# Patient Record
Sex: Female | Born: 1965 | Race: Black or African American | Hispanic: No | Marital: Single | Smoking: Never smoker
Health system: Southern US, Community
[De-identification: ages and names within clinical notes are randomized; demographics above are authoritative.]

## PROBLEM LIST (undated history)

## (undated) DIAGNOSIS — K219 Gastro-esophageal reflux disease without esophagitis: Secondary | ICD-10-CM

## (undated) DIAGNOSIS — R519 Headache, unspecified: Secondary | ICD-10-CM

## (undated) DIAGNOSIS — I10 Essential (primary) hypertension: Secondary | ICD-10-CM

## (undated) DIAGNOSIS — M25569 Pain in unspecified knee: Secondary | ICD-10-CM

## (undated) DIAGNOSIS — F32A Depression, unspecified: Secondary | ICD-10-CM

## (undated) DIAGNOSIS — M7989 Other specified soft tissue disorders: Secondary | ICD-10-CM

## (undated) DIAGNOSIS — E785 Hyperlipidemia, unspecified: Secondary | ICD-10-CM

## (undated) DIAGNOSIS — K829 Disease of gallbladder, unspecified: Secondary | ICD-10-CM

## (undated) DIAGNOSIS — K5900 Constipation, unspecified: Secondary | ICD-10-CM

## (undated) DIAGNOSIS — M549 Dorsalgia, unspecified: Secondary | ICD-10-CM

## (undated) DIAGNOSIS — D573 Sickle-cell trait: Secondary | ICD-10-CM

## (undated) DIAGNOSIS — R5383 Other fatigue: Secondary | ICD-10-CM

## (undated) DIAGNOSIS — R0602 Shortness of breath: Secondary | ICD-10-CM

## (undated) DIAGNOSIS — M2550 Pain in unspecified joint: Secondary | ICD-10-CM

## (undated) DIAGNOSIS — R232 Flushing: Secondary | ICD-10-CM

## (undated) HISTORY — DX: Disease of gallbladder, unspecified: K82.9

## (undated) HISTORY — DX: Other fatigue: R53.83

## (undated) HISTORY — DX: Hyperlipidemia, unspecified: E78.5

## (undated) HISTORY — DX: Other specified soft tissue disorders: M79.89

## (undated) HISTORY — DX: Depression, unspecified: F32.A

## (undated) HISTORY — DX: Essential (primary) hypertension: I10

## (undated) HISTORY — DX: Headache, unspecified: R51.9

## (undated) HISTORY — DX: Dorsalgia, unspecified: M54.9

## (undated) HISTORY — DX: Gastro-esophageal reflux disease without esophagitis: K21.9

## (undated) HISTORY — DX: Constipation, unspecified: K59.00

## (undated) HISTORY — DX: Pain in unspecified joint: M25.50

## (undated) HISTORY — DX: Flushing: R23.2

## (undated) HISTORY — DX: Pain in unspecified knee: M25.569

## (undated) HISTORY — DX: Sickle-cell trait: D57.3

## (undated) HISTORY — DX: Shortness of breath: R06.02

---

## 2001-05-29 HISTORY — PX: TUBAL LIGATION: SHX77

## 2007-05-30 HISTORY — PX: CHOLECYSTECTOMY: SHX55

## 2010-05-29 HISTORY — PX: PARTIAL HYSTERECTOMY: SHX80

## 2014-03-02 ENCOUNTER — Other Ambulatory Visit (HOSPITAL_COMMUNITY)
Admission: RE | Admit: 2014-03-02 | Discharge: 2014-03-02 | Disposition: A | Discharge status: Home / Self Care | Payer: Managed Care, Other (non HMO) | Source: Ambulatory Visit | Attending: Family Medicine | Admitting: Family Medicine

## 2014-03-02 DIAGNOSIS — Z1151 Encounter for screening for human papillomavirus (HPV): Secondary | ICD-10-CM | POA: Diagnosis present

## 2014-03-02 DIAGNOSIS — Z124 Encounter for screening for malignant neoplasm of cervix: Secondary | ICD-10-CM | POA: Diagnosis not present

## 2014-03-18 ENCOUNTER — Other Ambulatory Visit: Payer: Self-pay

## 2014-03-18 DIAGNOSIS — Z1239 Encounter for other screening for malignant neoplasm of breast: Secondary | ICD-10-CM

## 2014-04-10 ENCOUNTER — Ambulatory Visit
Admission: RE | Admit: 2014-04-10 | Discharge: 2014-04-10 | Disposition: A | Discharge status: Home / Self Care | Payer: Managed Care, Other (non HMO) | Source: Ambulatory Visit

## 2014-04-10 DIAGNOSIS — Z1239 Encounter for other screening for malignant neoplasm of breast: Secondary | ICD-10-CM

## 2015-05-30 HISTORY — PX: OTHER SURGICAL HISTORY: SHX169

## 2016-04-12 ENCOUNTER — Ambulatory Visit
Admission: RE | Admit: 2016-04-12 | Discharge: 2016-04-12 | Disposition: A | Discharge status: Home / Self Care | Payer: BC Managed Care – PPO | Source: Ambulatory Visit | Attending: Internal Medicine | Admitting: Internal Medicine

## 2016-04-12 ENCOUNTER — Other Ambulatory Visit: Payer: Self-pay | Admitting: Internal Medicine

## 2016-04-12 DIAGNOSIS — Z1231 Encounter for screening mammogram for malignant neoplasm of breast: Secondary | ICD-10-CM

## 2018-08-07 ENCOUNTER — Encounter (INDEPENDENT_AMBULATORY_CARE_PROVIDER_SITE_OTHER): Payer: Self-pay

## 2018-08-13 ENCOUNTER — Ambulatory Visit (INDEPENDENT_AMBULATORY_CARE_PROVIDER_SITE_OTHER): Payer: Self-pay | Admitting: Family Medicine

## 2018-08-27 ENCOUNTER — Ambulatory Visit (INDEPENDENT_AMBULATORY_CARE_PROVIDER_SITE_OTHER): Payer: Self-pay | Admitting: Family Medicine

## 2019-04-11 ENCOUNTER — Other Ambulatory Visit: Payer: Self-pay | Admitting: Family Medicine

## 2019-04-11 DIAGNOSIS — Z1231 Encounter for screening mammogram for malignant neoplasm of breast: Secondary | ICD-10-CM

## 2019-06-02 ENCOUNTER — Ambulatory Visit: Payer: BC Managed Care – PPO

## 2019-11-18 ENCOUNTER — Other Ambulatory Visit: Payer: Self-pay

## 2019-11-18 ENCOUNTER — Ambulatory Visit
Admission: RE | Admit: 2019-11-18 | Discharge: 2019-11-18 | Disposition: A | Discharge status: Home / Self Care | Payer: BC Managed Care – PPO | Source: Ambulatory Visit | Attending: Family Medicine | Admitting: Family Medicine

## 2019-11-18 DIAGNOSIS — Z1231 Encounter for screening mammogram for malignant neoplasm of breast: Secondary | ICD-10-CM

## 2020-07-07 ENCOUNTER — Other Ambulatory Visit: Payer: Self-pay

## 2020-07-07 ENCOUNTER — Encounter (INDEPENDENT_AMBULATORY_CARE_PROVIDER_SITE_OTHER): Payer: Self-pay | Admitting: Family Medicine

## 2020-07-07 ENCOUNTER — Ambulatory Visit (INDEPENDENT_AMBULATORY_CARE_PROVIDER_SITE_OTHER): Payer: 59 | Admitting: Family Medicine

## 2020-07-07 VITALS — BP 150/88 | HR 72 | Temp 98.1°F | Ht 65.0 in | Wt 296.0 lb

## 2020-07-07 DIAGNOSIS — R5383 Other fatigue: Secondary | ICD-10-CM | POA: Diagnosis not present

## 2020-07-07 DIAGNOSIS — R0602 Shortness of breath: Secondary | ICD-10-CM | POA: Diagnosis not present

## 2020-07-07 DIAGNOSIS — I10 Essential (primary) hypertension: Secondary | ICD-10-CM | POA: Diagnosis not present

## 2020-07-07 DIAGNOSIS — E782 Mixed hyperlipidemia: Secondary | ICD-10-CM

## 2020-07-07 DIAGNOSIS — Z1331 Encounter for screening for depression: Secondary | ICD-10-CM | POA: Diagnosis not present

## 2020-07-07 DIAGNOSIS — Z6841 Body Mass Index (BMI) 40.0 and over, adult: Secondary | ICD-10-CM

## 2020-07-07 DIAGNOSIS — Z9189 Other specified personal risk factors, not elsewhere classified: Secondary | ICD-10-CM

## 2020-07-07 DIAGNOSIS — K9041 Non-celiac gluten sensitivity: Secondary | ICD-10-CM

## 2020-07-07 DIAGNOSIS — Z0289 Encounter for other administrative examinations: Secondary | ICD-10-CM

## 2020-07-08 LAB — CBC WITH DIFFERENTIAL/PLATELET
Basos: 1 %
Hemoglobin: 12.9 g/dL (ref 11.1–15.9)
Monocytes: 6 %
Neutrophils: 43 %

## 2020-07-08 LAB — VITAMIN B12: Vitamin B-12: 631 pg/mL (ref 232–1245)

## 2020-07-09 LAB — LIPID PANEL WITH LDL/HDL RATIO
Cholesterol, Total: 125 mg/dL (ref 100–199)
HDL: 34 mg/dL — ABNORMAL LOW (ref >39)
LDL Chol Calc (NIH): 75 mg/dL (ref 0–99)
LDL/HDL Ratio: 2.2 ratio (ref 0.0–3.2)
Triglycerides: 78 mg/dL (ref 0–149)
VLDL Cholesterol Cal: 16 mg/dL (ref 5–40)

## 2020-07-09 LAB — TSH: TSH: 3.48 u[IU]/mL (ref 0.450–4.500)

## 2020-07-09 LAB — INSULIN, RANDOM: INSULIN: 11.9 u[IU]/mL (ref 2.6–24.9)

## 2020-07-09 LAB — CBC WITH DIFFERENTIAL/PLATELET
Basophils Absolute: 0 10*3/uL (ref 0.0–0.2)
EOS (ABSOLUTE): 0.2 10*3/uL (ref 0.0–0.4)
Eos: 3 %
Hematocrit: 38.8 % (ref 34.0–46.6)
Immature Grans (Abs): 0 10*3/uL (ref 0.0–0.1)
Immature Granulocytes: 0 %
Lymphocytes Absolute: 2.8 10*3/uL (ref 0.7–3.1)
Lymphs: 47 %
MCH: 29.9 pg (ref 26.6–33.0)
MCHC: 33.2 g/dL (ref 31.5–35.7)
MCV: 90 fL (ref 79–97)
Monocytes Absolute: 0.3 10*3/uL (ref 0.1–0.9)
Neutrophils Absolute: 2.6 10*3/uL (ref 1.4–7.0)
Platelets: 321 10*3/uL (ref 150–450)
RBC: 4.31 x10E6/uL (ref 3.77–5.28)
RDW: 12.5 % (ref 11.7–15.4)
WBC: 6 10*3/uL (ref 3.4–10.8)

## 2020-07-09 LAB — T3: T3, Total: 115 ng/dL (ref 71–180)

## 2020-07-09 LAB — COMPREHENSIVE METABOLIC PANEL
ALT: 22 IU/L (ref 0–32)
AST: 19 IU/L (ref 0–40)
Albumin/Globulin Ratio: 1.7 (ref 1.2–2.2)
Albumin: 4.5 g/dL (ref 3.8–4.9)
Alkaline Phosphatase: 86 IU/L (ref 44–121)
BUN/Creatinine Ratio: 11 (ref 9–23)
BUN: 9 mg/dL (ref 6–24)
Bilirubin Total: 0.4 mg/dL (ref 0.0–1.2)
CO2: 24 mmol/L (ref 20–29)
Calcium: 9 mg/dL (ref 8.7–10.2)
Chloride: 101 mmol/L (ref 96–106)
Creatinine, Ser: 0.82 mg/dL (ref 0.57–1.00)
GFR calc Af Amer: 94 mL/min/{1.73_m2} (ref >59)
GFR calc non Af Amer: 82 mL/min/{1.73_m2} (ref >59)
Globulin, Total: 2.7 g/dL (ref 1.5–4.5)
Glucose: 88 mg/dL (ref 65–99)
Potassium: 4.6 mmol/L (ref 3.5–5.2)
Sodium: 141 mmol/L (ref 134–144)
Total Protein: 7.2 g/dL (ref 6.0–8.5)

## 2020-07-09 LAB — FOLATE: Folate: >20 ng/mL (ref >3.0)

## 2020-07-09 LAB — GLIADIN IGA+TTG IGA
Antigliadin Abs, IgA: 4 units (ref 0–19)
Transglutaminase IgA: <2 U/mL (ref 0–3)

## 2020-07-09 LAB — T4: T4, Total: 7.2 ug/dL (ref 4.5–12.0)

## 2020-07-09 LAB — VITAMIN D 25 HYDROXY (VIT D DEFICIENCY, FRACTURES): Vit D, 25-Hydroxy: 4.4 ng/mL — ABNORMAL LOW (ref 30.0–100.0)

## 2020-07-09 LAB — HEMOGLOBIN A1C
Est. average glucose Bld gHb Est-mCnc: 114 mg/dL
Hgb A1c MFr Bld: 5.6 % (ref 4.8–5.6)

## 2020-07-12 NOTE — Progress Notes (Unsigned)
Chief Complaint:   OBESITY Brandy Cannon (MR# [id number]) is a 55 y.o. female who presents for evaluation and treatment of obesity and related comorbidities. Current BMI is Body mass index is 49.26 kg/m. Brandy Cannon has been struggling with her weight for many years and has been unsuccessful in either losing weight, maintaining weight loss, or reaching her healthy weight goal.  Brandy Cannon is currently in the action stage of change and ready to dedicate time achieving and maintaining a healthier weight. Brandy Cannon is interested in becoming our patient and working on intensive lifestyle modifications including (but not limited to) diet and exercise for weight loss.  Brandy Cannon's habits were reviewed today and are as follows: her desired weight loss is 136 lbs, she has been heavy most of her life, she started gaining weight at 39, her heaviest weight ever was 300 pounds, she is a picky eater and doesn't like to eat healthier foods, she has significant food cravings issues, she snacks frequently in the evenings, she wakes up frequently in the middle of the night to eat, she skips meals frequently, she is frequently drinking liquids with calories, she frequently makes poor food choices, she has problems with excessive hunger, she frequently eats larger portions than normal and she struggles with emotional eating.  Depression Screen Brandy Cannon's Food and Mood (modified PHQ-9) score was 19.  Depression screen PHQ 2/9 07/07/2020  Decreased Interest 2  Down, Depressed, Hopeless 3  PHQ - 2 Score 5  Altered sleeping 3  Tired, decreased energy 3  Change in appetite 2  Feeling bad or failure about yourself  3  Trouble concentrating 2  Moving slowly or fidgety/restless 0  Suicidal thoughts 1  PHQ-9 Score 19  Difficult doing work/chores Somewhat difficult   Subjective:   1. Other fatigue Brandy Cannon admits to daytime somnolence and admits to waking up still tired. Patent has a history of symptoms of daytime fatigue and  morning headache. Brandy Cannon generally gets 2 or 3 hours of sleep per night, and states that she has nightime awakenings. Snoring is present. Apneic episodes are not present. Epworth Sleepiness Score is 8.  2. Shortness of breath on exertion Brandy Cannon notes increasing shortness of breath with exercising and seems to be worsening over time with weight gain. She notes getting out of breath sooner with activity than she used to. This has not gotten worse recently. Brandy Cannon denies shortness of breath at rest or orthopnea.  3. Primary hypertension Brandy Cannon's blood pressure is elevated today. She is on Benicar, and she is ready to work on diet and weight loss.  4. Mixed hyperlipidemia Brandy Cannon is on Lipitor, and she is working on diet and weight loss. She denies chest pain.  5. Gluten intolerance Brandy Cannon wonders if she has gluten intolerance, as she seems to have GI issues. She has not been evaluated for this previously. She still eats gluten but not as much.  6. At risk for heart disease Brandy Cannon is at a higher than average risk for cardiovascular disease due to obesity.   Assessment/Plan:   1. Other fatigue Brandy Cannon does feel that her weight is causing her energy to be lower than it should be. Fatigue may be related to obesity, depression or many other causes. Labs will be ordered, and in the meanwhile, Brandy Cannon will focus on self care including making healthy food choices, increasing physical activity and focusing on stress reduction.  - Vitamin B12 - CBC with Differential/Platelet - EKG 12-Lead - Folate - Hemoglobin A1c - Insulin, random -  T3 - T4 - TSH - VITAMIN D 25 Hydroxy (Vit-D Deficiency, Fractures)  2. Shortness of breath on exertion Brandy Cannon does feel that she gets out of breath more easily that she used to when she exercises. Brandy Cannon's shortness of breath appears to be obesity related and exercise induced. She has agreed to work on weight loss and gradually increase exercise to treat her exercise  induced shortness of breath. Will continue to monitor closely.  3. Primary hypertension We will check labs today. Brandy Cannon will start her Category 3 plan, and will work on healthy weight loss and exercise to improve blood pressure control. We will watch for signs of hypotension as she continues her lifestyle modifications.  - Comprehensive metabolic panel  4. Mixed hyperlipidemia Cardiovascular risk and specific lipid/LDL goals reviewed. We discussed several lifestyle modifications today. Brandy Cannon will start her Category 3 plan, and will work on exercise and weight loss efforts. We will check labs today. Orders and follow up as documented in patient record.   - Lipid Panel With LDL/HDL Ratio  5. Gluten intolerance We will check labs today. Brandy Cannon will continue to follow up as directed.  - Gliadin IgA+tTG IgA  6. Screening for depression Brandy Cannon had a positive depression screening. Depression is commonly associated with obesity and often results in emotional eating behaviors. We will monitor this closely and work on CBT to help improve the non-hunger eating patterns. Referral to Psychology may be required if no improvement is seen as she continues in our clinic.  7. At risk for heart disease Brandy Cannon was given approximately 30 minutes of coronary artery disease prevention counseling today. She is 55 y.o. female and has risk factors for heart disease including obesity. We discussed intensive lifestyle modifications today with an emphasis on specific weight loss instructions and strategies.   Repetitive spaced learning was employed today to elicit superior memory formation and behavioral change.  8. Class 3 severe obesity with serious comorbidity and body mass index (BMI) of 45.0 to 49.9 in adult, unspecified obesity type (HCC) Brandy Cannon is currently in the action stage of change and her goal is to continue with weight loss efforts. I recommend Brandy Cannon begin the structured treatment plan as  follows:  She has agreed to the Category 3 Plan.  Exercise goals: No exercise has been prescribed at this time.   Behavioral modification strategies: increasing lean protein intake and no skipping meals.  She was informed of the importance of frequent follow-up visits to maximize her success with intensive lifestyle modifications for her multiple health conditions. She was informed we would discuss her lab results at her next visit unless there is a critical issue that needs to be addressed sooner. Brandy Cannon agreed to keep her next visit at the agreed upon time to discuss these results.  Objective:   Blood pressure (!) 150/88, pulse 72, temperature 98.1 F (36.7 C), height 5\' 5"  (1.651 m), weight 296 lb (134.3 kg), SpO2 99 %. Body mass index is 49.26 kg/m.  EKG: Normal sinus rhythm, rate 73 BPM.  Indirect Calorimeter completed today shows a VO2 of 365 and a REE of 2539.  Her calculated basal metabolic rate is thus her basal metabolic rate is better than expected.  General: Cooperative, alert, well developed, in no acute distress. HEENT: Conjunctivae and lids unremarkable. Cardiovascular: Regular rhythm.  Lungs: Normal work of breathing. Neurologic: No focal deficits.   Lab Results  Component Value Date   CREATININE 0.82 07/07/2020   BUN 9 07/07/2020   NA 141  07/07/2020   K 4.6 07/07/2020   CL 101 07/07/2020   CO2 24 07/07/2020   Lab Results  Component Value Date   ALT 22 07/07/2020   AST 19 07/07/2020   ALKPHOS 86 07/07/2020   BILITOT 0.4 07/07/2020   Lab Results  Component Value Date   HGBA1C 5.6 07/07/2020   Lab Results  Component Value Date   INSULIN 11.9 07/07/2020   Lab Results  Component Value Date   TSH 3.480 07/07/2020   Lab Results  Component Value Date   CHOL 125 07/07/2020   HDL 34 (L) 07/07/2020   LDLCALC 75 07/07/2020   TRIG 78 07/07/2020   Lab Results  Component Value Date   WBC 6.0 07/07/2020   HGB 12.9 07/07/2020   HCT 38.8 07/07/2020    MCV 90 07/07/2020   PLT 321 07/07/2020   No results found for: IRON, TIBC, FERRITIN  Attestation Statements:   Reviewed by clinician on day of visit: allergies, medications, problem list, medical history, surgical history, family history, social history, and previous encounter notes.   I, Burt Knack, am acting as transcriptionist for Quillian Quince, MD.  I have reviewed the above documentation for accuracy and completeness, and I agree with the above. - Quillian Quince, MD

## 2020-07-21 ENCOUNTER — Ambulatory Visit (INDEPENDENT_AMBULATORY_CARE_PROVIDER_SITE_OTHER): Payer: 59 | Admitting: Family Medicine

## 2020-07-21 ENCOUNTER — Other Ambulatory Visit: Payer: Self-pay

## 2020-07-21 ENCOUNTER — Encounter (INDEPENDENT_AMBULATORY_CARE_PROVIDER_SITE_OTHER): Payer: Self-pay | Admitting: Family Medicine

## 2020-07-21 VITALS — BP 139/83 | HR 71 | Temp 98.2°F | Ht 65.0 in | Wt 288.0 lb

## 2020-07-21 DIAGNOSIS — Z9189 Other specified personal risk factors, not elsewhere classified: Secondary | ICD-10-CM | POA: Diagnosis not present

## 2020-07-21 DIAGNOSIS — Z6841 Body Mass Index (BMI) 40.0 and over, adult: Secondary | ICD-10-CM | POA: Diagnosis not present

## 2020-07-21 DIAGNOSIS — E8881 Metabolic syndrome: Secondary | ICD-10-CM | POA: Diagnosis not present

## 2020-07-21 DIAGNOSIS — E559 Vitamin D deficiency, unspecified: Secondary | ICD-10-CM | POA: Diagnosis not present

## 2020-07-21 MED ORDER — VITAMIN D (ERGOCALCIFEROL) 1.25 MG (50000 UNIT) PO CAPS: 50000.0000 [IU] | ORAL_CAPSULE | ORAL | 0 refills | Status: DC

## 2020-07-22 NOTE — Progress Notes (Signed)
Chief Complaint:   OBESITY Brandy Cannon is here to discuss her progress with her obesity treatment plan along with follow-up of her obesity related diagnoses. Brandy Cannon is on the Category 3 Plan and states she is following her eating plan approximately 25% of the time. Brandy Cannon states she is doing 0 minutes 0 times per week.  Today's visit was #: 2 Starting weight: 296 lbs Starting date: 07/07/2020 Today's weight: 288 lbs Today's date: 07/21/2020 Total lbs lost to date: 8 Total lbs lost since last in-office visit: 8  Interim History: Brandy Cannon has done very well with weight loss on her eating plan. She struggled to eat everything on her plan however, but her hunger was controlled.  Subjective:   1. Vitamin D deficiency Brandy Cannon's Vit D level is very low at 4.4. She notes significant fatigue. I discussed labs with the patient today.  2. Insulin resistance Brandy Cannon has a new diagnosis of insulin resistance. She notes decreased polyphagia with increased protein. I discussed labs with the patient today.  3. At risk for diabetes mellitus Brandy Cannon is at higher than average risk for developing diabetes due to obesity.   Assessment/Plan:   1. Vitamin D deficiency Low Vitamin D level contributes to fatigue and are associated with obesity, breast, and colon cancer. Brandy Cannon agreed to start prescription Vitamin D 50,000 IU every week with no refills. We will recheck labs in 3 months. She will follow-up for routine testing of Vitamin D, at least 2-3 times per year to avoid over-replacement.  - Vitamin D, Ergocalciferol, (DRISDOL) 1.25 MG (50000 UNIT) CAPS capsule; Take 1 capsule (50,000 Units total) by mouth every 3 (three) days.  Dispense: 10 capsule; Refill: 0  2. Insulin resistance Brandy Cannon will continue to work on weight loss, diet, exercise, and decreasing simple carbohydrates to help decrease the risk of diabetes. We will recheck labs in 3 months. Brandy Cannon agreed to follow-up with [place] as directed to closely  monitor her progress.  3. At risk for diabetes mellitus Brandy Cannon was given approximately 30 minutes of diabetes education and counseling today. We discussed intensive lifestyle modifications today with an emphasis on weight loss as well as increasing exercise and decreasing simple carbohydrates in her diet. We also reviewed medication options with an emphasis on risk versus benefit of those discussed.   Repetitive spaced learning was employed today to elicit superior memory formation and behavioral change.  4. Class 3 severe obesity with serious comorbidity and body mass index (BMI) of 45.0 to 49.9 in adult, unspecified obesity type (HCC) Brandy Cannon is currently in the action stage of change. As such, her goal is to continue with weight loss efforts. She has agreed to change to keeping a food journal and adhering to recommended goals of 1600-1800 calories and 100+ grams of protein daily.   Behavioral modification strategies: increasing lean protein intake, no skipping meals and meal planning and cooking strategies.  Brandy Cannon has agreed to follow-up with our clinic in 2 weeks. She was informed of the importance of frequent follow-up visits to maximize her success with intensive lifestyle modifications for her multiple health conditions.   Objective:   Blood pressure 139/83, pulse 71, temperature 98.2 F (36.8 C), height 5\' 5"  (1.651 m), weight 288 lb (130.6 kg), SpO2 99 %. Body mass index is 47.93 kg/m.  General: Cooperative, alert, well developed, in no acute distress. HEENT: Conjunctivae and lids unremarkable. Cardiovascular: Regular rhythm.  Lungs: Normal work of breathing. Neurologic: No focal deficits.   Lab Results  Component Value  Date   CREATININE 0.82 07/07/2020   BUN 9 07/07/2020   NA 141 07/07/2020   K 4.6 07/07/2020   CL 101 07/07/2020   CO2 24 07/07/2020   Lab Results  Component Value Date   ALT 22 07/07/2020   AST 19 07/07/2020   ALKPHOS 86 07/07/2020   BILITOT 0.4  07/07/2020   Lab Results  Component Value Date   HGBA1C 5.6 07/07/2020   Lab Results  Component Value Date   INSULIN 11.9 07/07/2020   Lab Results  Component Value Date   TSH 3.480 07/07/2020   Lab Results  Component Value Date   CHOL 125 07/07/2020   HDL 34 (L) 07/07/2020   LDLCALC 75 07/07/2020   TRIG 78 07/07/2020   Lab Results  Component Value Date   WBC 6.0 07/07/2020   HGB 12.9 07/07/2020   HCT 38.8 07/07/2020   MCV 90 07/07/2020   PLT 321 07/07/2020   No results found for: IRON, TIBC, FERRITIN  Attestation Statements:   Reviewed by clinician on day of visit: allergies, medications, problem list, medical history, surgical history, family history, social history, and previous encounter notes.   I, Burt Knack, am acting as transcriptionist for Quillian Quince, MD.  I have reviewed the above documentation for accuracy and completeness, and I agree with the above. -  Quillian Quince, MD

## 2020-08-04 ENCOUNTER — Ambulatory Visit (INDEPENDENT_AMBULATORY_CARE_PROVIDER_SITE_OTHER): Payer: 59 | Admitting: Family Medicine

## 2020-08-04 ENCOUNTER — Other Ambulatory Visit: Payer: Self-pay

## 2020-08-04 ENCOUNTER — Encounter (INDEPENDENT_AMBULATORY_CARE_PROVIDER_SITE_OTHER): Payer: Self-pay | Admitting: Family Medicine

## 2020-08-04 VITALS — BP 140/82 | HR 75 | Temp 98.5°F | Ht 65.0 in | Wt 284.0 lb

## 2020-08-04 DIAGNOSIS — I10 Essential (primary) hypertension: Secondary | ICD-10-CM

## 2020-08-04 DIAGNOSIS — E559 Vitamin D deficiency, unspecified: Secondary | ICD-10-CM

## 2020-08-04 DIAGNOSIS — Z9189 Other specified personal risk factors, not elsewhere classified: Secondary | ICD-10-CM | POA: Diagnosis not present

## 2020-08-04 DIAGNOSIS — K5909 Other constipation: Secondary | ICD-10-CM | POA: Diagnosis not present

## 2020-08-04 DIAGNOSIS — Z6841 Body Mass Index (BMI) 40.0 and over, adult: Secondary | ICD-10-CM

## 2020-08-04 MED ORDER — VITAMIN D (ERGOCALCIFEROL) 1.25 MG (50000 UNIT) PO CAPS: 50000.0000 [IU] | ORAL_CAPSULE | ORAL | 0 refills | Status: DC

## 2020-08-10 NOTE — Progress Notes (Signed)
Chief Complaint:   OBESITY Brandy Cannon is here to discuss her progress with her obesity treatment plan along with follow-up of her obesity related diagnoses. Brandy Cannon is on keeping a food journal and adhering to recommended goals of 1600-1800 calories and 100+ grams of protein daily and states she is following her eating plan approximately 50% of the time. Brandy Cannon states she is doing 0 minutes 0 times per week.  Today's visit was #: 3 Starting weight: 296 lbs Starting date: 07/07/2020 Today's weight: 284 lbs Today's date: 08/04/2020 Total lbs lost to date: 12 Total lbs lost since last in-office visit: 4  Interim History: Kahlani continues to do well with weight loss. She is struggling to journal all the time and meet her protein goals.  Subjective:   1. Other constipation Brandy Cannon notes her BMs have become harder and painful. She is now on miralax and she feels it is helping.  2. Essential hypertension Brandy Cannon's blood pressure is borderline elevated today. She has had increased work stress. She continues to do well with weight loss.  3. Vitamin D deficiency Brandy Cannon is on Vit D, and she feels her energy is already improving.  4. At risk for heart disease Brandy Cannon is at a higher than average risk for cardiovascular disease due to obesity.   Assessment/Plan:   1. Other constipation I explained to Brandy Cannon why her BMs have slowed with weight loss. She will continue to increase her water intake and will continue miralax 17 grams daily OTC is a good options. Orders and follow up as documented in patient record.   2. Essential hypertension Brandy Cannon will continue her medications, diet, and exercise to improve blood pressure control. We will continue to monitor, and will watch for signs of hypotension as she continues her lifestyle modifications.  3. Vitamin D deficiency Low Vitamin D level contributes to fatigue and are associated with obesity, breast, and colon cancer. We will refill prescription  Vitamin D for 1 month. Brandy Cannon will follow-up for routine testing of Vitamin D, at least 2-3 times per year to avoid over-replacement.  - Vitamin D, Ergocalciferol, (DRISDOL) 1.25 MG (50000 UNIT) CAPS capsule; Take 1 capsule (50,000 Units total) by mouth every 3 (three) days.  Dispense: 10 capsule; Refill: 0  4. At risk for heart disease Brandy Cannon was given approximately 15 minutes of coronary artery disease prevention counseling today. She is 55 y.o. female and has risk factors for heart disease including obesity. We discussed intensive lifestyle modifications today with an emphasis on specific weight loss instructions and strategies.   Repetitive spaced learning was employed today to elicit superior memory formation and behavioral change.  5. Class 3 severe obesity with serious comorbidity and body mass index (BMI) of 45.0 to 49.9 in adult, unspecified obesity type (HCC) Brandy Cannon is currently in the action stage of change. As such, her goal is to continue with weight loss efforts. She has agreed to keeping a food journal and adhering to recommended goals of 1600-1800 calories and 100+ grams of protein daily.   High protein recipes were given today.  Behavioral modification strategies: increasing lean protein intake, increasing water intake, meal planning and cooking strategies and keeping a strict food journal.  Brandy Cannon has agreed to follow-up with our clinic in 2 weeks. She was informed of the importance of frequent follow-up visits to maximize her success with intensive lifestyle modifications for her multiple health conditions.   Objective:   Blood pressure 140/82, pulse 75, temperature 98.5 F (36.9 C), height 5'  5" (1.651 m), weight 284 lb (128.8 kg), SpO2 100 %. Body mass index is 47.26 kg/m.  General: Cooperative, alert, well developed, in no acute distress. HEENT: Conjunctivae and lids unremarkable. Cardiovascular: Regular rhythm.  Lungs: Normal work of breathing. Neurologic: No focal  deficits.   Lab Results  Component Value Date   CREATININE 0.82 07/07/2020   BUN 9 07/07/2020   NA 141 07/07/2020   K 4.6 07/07/2020   CL 101 07/07/2020   CO2 24 07/07/2020   Lab Results  Component Value Date   ALT 22 07/07/2020   AST 19 07/07/2020   ALKPHOS 86 07/07/2020   BILITOT 0.4 07/07/2020   Lab Results  Component Value Date   HGBA1C 5.6 07/07/2020   Lab Results  Component Value Date   INSULIN 11.9 07/07/2020   Lab Results  Component Value Date   TSH 3.480 07/07/2020   Lab Results  Component Value Date   CHOL 125 07/07/2020   HDL 34 (L) 07/07/2020   LDLCALC 75 07/07/2020   TRIG 78 07/07/2020   Lab Results  Component Value Date   WBC 6.0 07/07/2020   HGB 12.9 07/07/2020   HCT 38.8 07/07/2020   MCV 90 07/07/2020   PLT 321 07/07/2020   No results found for: IRON, TIBC, FERRITIN  Attestation Statements:   Reviewed by clinician on day of visit: allergies, medications, problem list, medical history, surgical history, family history, social history, and previous encounter notes.   I, Burt Knack, am acting as transcriptionist for Quillian Quince, MD.  I have reviewed the above documentation for accuracy and completeness, and I agree with the above. -  Quillian Quince, MD

## 2020-08-12 ENCOUNTER — Other Ambulatory Visit (INDEPENDENT_AMBULATORY_CARE_PROVIDER_SITE_OTHER): Payer: Self-pay | Admitting: Family Medicine

## 2020-08-12 DIAGNOSIS — E559 Vitamin D deficiency, unspecified: Secondary | ICD-10-CM

## 2020-08-18 ENCOUNTER — Other Ambulatory Visit: Payer: Self-pay

## 2020-08-18 ENCOUNTER — Ambulatory Visit (INDEPENDENT_AMBULATORY_CARE_PROVIDER_SITE_OTHER): Payer: 59 | Admitting: Family Medicine

## 2020-08-18 ENCOUNTER — Encounter (INDEPENDENT_AMBULATORY_CARE_PROVIDER_SITE_OTHER): Payer: Self-pay | Admitting: Family Medicine

## 2020-08-18 VITALS — BP 119/77 | HR 65 | Temp 98.2°F | Ht 65.0 in | Wt 282.0 lb

## 2020-08-18 DIAGNOSIS — Z9189 Other specified personal risk factors, not elsewhere classified: Secondary | ICD-10-CM | POA: Diagnosis not present

## 2020-08-18 DIAGNOSIS — Z6841 Body Mass Index (BMI) 40.0 and over, adult: Secondary | ICD-10-CM

## 2020-08-18 DIAGNOSIS — I10 Essential (primary) hypertension: Secondary | ICD-10-CM | POA: Diagnosis not present

## 2020-08-18 DIAGNOSIS — G4709 Other insomnia: Secondary | ICD-10-CM

## 2020-08-18 MED ORDER — AMITRIPTYLINE HCL 25 MG PO TABS: 25.0000 mg | ORAL_TABLET | Freq: Every day | ORAL | 0 refills | Status: DC

## 2020-08-23 NOTE — Progress Notes (Signed)
Chief Complaint:   OBESITY Brandy Cannon is here to discuss her progress with her obesity treatment plan along with follow-up of her obesity related diagnoses. Brandy Cannon is on keeping a food journal and adhering to recommended goals of 1600-1800 calories and 100+ grams of protein daily and states she is following her eating plan approximately 99% of the time. Brandy Cannon states she is walking for 45 minutes 2 times per week.  Today's visit was #: 4 Starting weight: 296 lbs Starting date: 07/07/2020 Today's weight: 282 lbs Today's date: 08/18/2020 Total lbs lost to date: 14 Total lbs lost since last in-office visit: 2  Interim History: Brandy Cannon continues to do well with weight loss. She has had some increased cravings and increased stress as she is trying to move. She has had challenges with sleeping which can negativitely affect her weight.  Subjective:   1. Other insomnia Brandy Cannon is in the process of moving and she notes increased stress and difficultly falling asleep. This has not improved with turning off her screens.  2. Essential hypertension Brandy Cannon's blood pressure is well controlled on her medications. She denies signs of hypotension. She continue to do well with weight loss.  3. At risk for complication associated with hypotension Brandy Cannon is at a higher than average risk of hypotension due to weight loss.  Assessment/Plan:   1. Other insomnia The problem of recurrent insomnia was discussed. Orders and follow up as documented in patient record. Counseling: Intensive lifestyle modifications are the first line treatment for this issue. We discussed several lifestyle modifications today. Brandy Cannon agreed to start Elavil 25 mg qhs with no refills. She will continue to work on diet, exercise and weight loss efforts.   - amitriptyline (ELAVIL) 25 MG tablet; Take 1 tablet (25 mg total) by mouth at bedtime.  Dispense: 30 tablet; Refill: 0  2. Essential hypertension Brandy Cannon will continue Benicar as is,  and will continue to monitor. She will continue working on healthy weight loss and exercise to improve blood pressure control.  3. At risk for complication associated with hypotension Brandy Cannon was given approximately 15 minutes of education and counseling today to help avoid hypotension. We discussed risks of hypotension with weight loss and signs of hypotension such as feeling lightheaded or unsteady.  Repetitive spaced learning was employed today to elicit superior memory formation and behavioral change.  4. Obesity with current BMI 47.0 Brandy Cannon is currently in the action stage of change. As such, her goal is to continue with weight loss efforts. She has agreed to keeping a food journal and adhering to recommended goals of 1600-1800 calories and 100+ grams of protein daily.   Exercise goals: As is.  Behavioral modification strategies: meal planning and cooking strategies and emotional eating strategies.  Brandy Cannon has agreed to follow-up with our clinic in 2 weeks. She was informed of the importance of frequent follow-up visits to maximize her success with intensive lifestyle modifications for her multiple health conditions.   Objective:   Blood pressure 119/77, pulse 65, temperature 98.2 F (36.8 C), height 5\' 5"  (1.651 m), weight 282 lb (127.9 kg), SpO2 100 %. Body mass index is 46.93 kg/m.  General: Cooperative, alert, well developed, in no acute distress. HEENT: Conjunctivae and lids unremarkable. Cardiovascular: Regular rhythm.  Lungs: Normal work of breathing. Neurologic: No focal deficits.   Lab Results  Component Value Date   CREATININE 0.82 07/07/2020   BUN 9 07/07/2020   NA 141 07/07/2020   K 4.6 07/07/2020   CL 101  07/07/2020   CO2 24 07/07/2020   Lab Results  Component Value Date   ALT 22 07/07/2020   AST 19 07/07/2020   ALKPHOS 86 07/07/2020   BILITOT 0.4 07/07/2020   Lab Results  Component Value Date   HGBA1C 5.6 07/07/2020   Lab Results  Component Value  Date   INSULIN 11.9 07/07/2020   Lab Results  Component Value Date   TSH 3.480 07/07/2020   Lab Results  Component Value Date   CHOL 125 07/07/2020   HDL 34 (L) 07/07/2020   LDLCALC 75 07/07/2020   TRIG 78 07/07/2020   Lab Results  Component Value Date   WBC 6.0 07/07/2020   HGB 12.9 07/07/2020   HCT 38.8 07/07/2020   MCV 90 07/07/2020   PLT 321 07/07/2020   No results found for: IRON, TIBC, FERRITIN  Attestation Statements:   Reviewed by clinician on day of visit: allergies, medications, problem list, medical history, surgical history, family history, social history, and previous encounter notes.   I, Burt Knack, am acting as transcriptionist for Quillian Quince, MD.  I have reviewed the above documentation for accuracy and completeness, and I agree with the above. -  Quillian Quince, MD

## 2020-09-01 ENCOUNTER — Ambulatory Visit (INDEPENDENT_AMBULATORY_CARE_PROVIDER_SITE_OTHER): Payer: 59 | Admitting: Family Medicine

## 2020-09-01 ENCOUNTER — Other Ambulatory Visit: Payer: Self-pay

## 2020-09-01 ENCOUNTER — Encounter (INDEPENDENT_AMBULATORY_CARE_PROVIDER_SITE_OTHER): Payer: Self-pay | Admitting: Family Medicine

## 2020-09-01 VITALS — BP 123/79 | HR 84 | Temp 98.2°F | Ht 65.0 in | Wt 278.0 lb

## 2020-09-01 DIAGNOSIS — G4709 Other insomnia: Secondary | ICD-10-CM | POA: Diagnosis not present

## 2020-09-01 DIAGNOSIS — Z9189 Other specified personal risk factors, not elsewhere classified: Secondary | ICD-10-CM | POA: Diagnosis not present

## 2020-09-01 DIAGNOSIS — Z6841 Body Mass Index (BMI) 40.0 and over, adult: Secondary | ICD-10-CM | POA: Diagnosis not present

## 2020-09-01 DIAGNOSIS — E559 Vitamin D deficiency, unspecified: Secondary | ICD-10-CM

## 2020-09-01 MED ORDER — VITAMIN D (ERGOCALCIFEROL) 1.25 MG (50000 UNIT) PO CAPS: 50000.0000 [IU] | ORAL_CAPSULE | ORAL | 0 refills | Status: DC

## 2020-09-07 NOTE — Progress Notes (Signed)
Chief Complaint:   OBESITY Brandy Cannon is here to discuss her progress with her obesity treatment plan along with follow-up of her obesity related diagnoses.   Today's visit was #: 5 Starting weight: 296 lbs Starting date: 07/07/2020 Today's weight: 278 lbs Today's date: 09/01/2020 Total lbs lost to date: 18 lbs Body mass index is 46.26 kg/m.  Total weight loss percentage to date: -6.08%  Interim History:  Brandy Cannon is Dr. Francena Hanly patient.  She has been doing more stress eating this past week than she was the first week when she was more strict on the plan.  She says she has purchased a bike.  Plan:  Track better using app.  Current Meal Plan: keeping a food journal and adhering to recommended goals of 1600-1800 calories and 100 grams of protein for 50% of the time.  Current Exercise Plan: Daily activity.  Assessment/Plan:   1. Other insomnia This is poorly controlled. Current treatment: Elavil 25 mg at bedtime as needed for sleep.  She was started on Elavil by Dr. Dalbert Garnet at last office visit.  She took it for 3-4 nights, but felt it may have caused a skin breakout and excessive sleepiness the next day.  However, she is very sensitive to new foods/drugs and she tried a new food that could have caused her breakout as well.  Plan:  Decrease Elavil dose to 1/2 tablet at bedtime.  We will see how she does with this change at her next office visit.  Recommend sleep hygiene measures including regular sleep schedule, optimal sleep environment, and relaxing presleep rituals.   2. Vitamin D deficiency Not at goal. Current vitamin D is 4.4, tested on 07/07/2020. Optimal goal > 50 ng/dL.  She is taking vitamin D 50,000 IU every 3 days.  Plan: Continue to take prescription Vitamin D @50 ,000 IU every 3 days as prescribed.  Follow-up for routine testing of Vitamin D, at least 2-3 times per year to avoid over-replacement.  Will refill vitamin D today, as per below.  - Refill Vitamin D,  Ergocalciferol, (DRISDOL) 1.25 MG (50000 UNIT) CAPS capsule; Take 1 capsule (50,000 Units total) by mouth every 3 (three) days.  Dispense: 10 capsule; Refill: 0  3. At risk for adverse drug interaction Due to Brandy Cannon's current conditions and medications, she is at a higher risk for drug side effect.  At least 9 minutes was spent on counseling her about these concerns today.  We discussed the benefits and potential risks of these medications, and all of patient's concerns were addressed and questions were answered.  she will call , or their PCP or other specialists who treat their conditions with medications, with any questions or concerns that may develop.    4. Obesity with current BMI of 46.4  Course: Brandy Cannon is currently in the action stage of change. As such, her goal is to continue with weight loss efforts.   Nutrition goals: She has agreed to keeping a food journal and adhering to recommended goals of 1600-1800 calories and 100 grams of protein.   Exercise goals: As is.  Behavioral modification strategies: increasing lean protein intake, decreasing simple carbohydrates, keeping healthy foods in the home, better snacking choices, avoiding temptations, planning for success and keeping a strict food journal.  Brandy Cannon has agreed to follow-up with our clinic in 2 weeks. She was informed of the importance of frequent follow-up visits to maximize her success with intensive lifestyle modifications for her multiple health conditions.   Objective:   Blood  pressure 123/79, pulse 84, temperature 98.2 F (36.8 C), height 5\' 5"  (1.651 m), weight 278 lb (126.1 kg), SpO2 99 %. Body mass index is 46.26 kg/m.  General: Cooperative, alert, well developed, in no acute distress. HEENT: Conjunctivae and lids unremarkable. Cardiovascular: Regular rhythm.  Lungs: Normal work of breathing. Neurologic: No focal deficits.   Lab Results  Component Value Date   CREATININE 0.82 07/07/2020   BUN 9 07/07/2020    NA 141 07/07/2020   K 4.6 07/07/2020   CL 101 07/07/2020   CO2 24 07/07/2020   Lab Results  Component Value Date   ALT 22 07/07/2020   AST 19 07/07/2020   ALKPHOS 86 07/07/2020   BILITOT 0.4 07/07/2020   Lab Results  Component Value Date   HGBA1C 5.6 07/07/2020   Lab Results  Component Value Date   INSULIN 11.9 07/07/2020   Lab Results  Component Value Date   TSH 3.480 07/07/2020   Lab Results  Component Value Date   CHOL 125 07/07/2020   HDL 34 (L) 07/07/2020   LDLCALC 75 07/07/2020   TRIG 78 07/07/2020   Lab Results  Component Value Date   WBC 6.0 07/07/2020   HGB 12.9 07/07/2020   HCT 38.8 07/07/2020   MCV 90 07/07/2020   PLT 321 07/07/2020   Attestation Statements:   Reviewed by clinician on day of visit: allergies, medications, problem list, medical history, surgical history, family history, social history, and previous encounter notes.  I, 09/04/2020, CMA, am acting as Insurance claims handler for Energy manager, DO.  I have reviewed the above documentation for accuracy and completeness, and I agree with the above. [employer], D.O.  The 21st Century Cures Act was signed into law in 2016 which includes the topic of electronic health records.  This provides immediate access to information in MyChart.  This includes consultation notes, operative notes, office notes, lab results and pathology reports.  If you have any questions about what you read please let 2017 know at your next visit so we can discuss your concerns and take corrective action if need be.  We are right here with you.  Medications Discontinued During This Encounter  Medication Reason  . Vitamin D, Ergocalciferol, (DRISDOL) 1.25 MG (50000 UNIT) CAPS capsule Reorder     Meds ordered this encounter  Medications  . Vitamin D, Ergocalciferol, (DRISDOL) 1.25 MG (50000 UNIT) CAPS capsule    Sig: Take 1 capsule (50,000 Units total) by mouth every 3 (three) days.    Dispense:  10 capsule     Refill:  0

## 2020-09-09 ENCOUNTER — Other Ambulatory Visit (INDEPENDENT_AMBULATORY_CARE_PROVIDER_SITE_OTHER): Payer: Self-pay | Admitting: Family Medicine

## 2020-09-09 DIAGNOSIS — G4709 Other insomnia: Secondary | ICD-10-CM

## 2020-09-09 DIAGNOSIS — E559 Vitamin D deficiency, unspecified: Secondary | ICD-10-CM

## 2020-09-15 ENCOUNTER — Ambulatory Visit (INDEPENDENT_AMBULATORY_CARE_PROVIDER_SITE_OTHER): Payer: 59 | Admitting: Family Medicine

## 2020-09-15 ENCOUNTER — Other Ambulatory Visit: Payer: Self-pay

## 2020-09-15 ENCOUNTER — Encounter (INDEPENDENT_AMBULATORY_CARE_PROVIDER_SITE_OTHER): Payer: Self-pay | Admitting: Family Medicine

## 2020-09-15 VITALS — BP 136/82 | HR 73 | Temp 98.4°F | Ht 65.0 in | Wt 281.0 lb

## 2020-09-15 DIAGNOSIS — Z9189 Other specified personal risk factors, not elsewhere classified: Secondary | ICD-10-CM

## 2020-09-15 DIAGNOSIS — Z6841 Body Mass Index (BMI) 40.0 and over, adult: Secondary | ICD-10-CM | POA: Diagnosis not present

## 2020-09-15 DIAGNOSIS — G4709 Other insomnia: Secondary | ICD-10-CM | POA: Diagnosis not present

## 2020-09-15 DIAGNOSIS — F3289 Other specified depressive episodes: Secondary | ICD-10-CM | POA: Diagnosis not present

## 2020-09-15 MED ORDER — BUPROPION HCL ER (SR) 150 MG PO TB12: 150.0000 mg | ORAL_TABLET | Freq: Every morning | ORAL | 0 refills | Status: DC

## 2020-09-20 NOTE — Progress Notes (Signed)
Chief Complaint:   OBESITY Brandy Cannon is here to discuss her progress with her obesity treatment plan along with follow-up of her obesity related diagnoses. Brandy Cannon is on keeping a food journal and adhering to recommended goals of 1600-1800 calories and 100 grams of protein daily and states she is following her eating plan approximately 50% of the time. Brandy Cannon states she is doing 0 minutes 0 times per week.  Today's visit was #: 6 Starting weight: 296 lbs Starting date: 07/07/2020 Today's weight: 281 lbs Today's date: 09/15/2020 Total lbs lost to date: 15 Total lbs lost since last in-office visit: 0  Interim History: Brandy Cannon has had a lot of stress recently. She is moving and renovating her new place, and she is feeling over-whelmed.  Subjective:   1. Other insomnia Brandy Cannon feels trazodone has helped her sleep, but also seems to make her face breakout. This is a common problem with her and some medications.  2. Other depression with emotional eating Brandy Cannon notes increased stress eating recently, and feeling overwhelmed. She is open to looking at medication options to help right now. She denies a history of seizures.  3. At risk for impaired metabolic function Brandy Cannon is at increased risk for impaired metabolic function if protein decreases.  Assessment/Plan:   1. Other insomnia The problem of recurrent insomnia was discussed. Orders and follow up as documented in patient record. Counseling: Intensive lifestyle modifications are the first line treatment for this issue. We discussed several lifestyle modifications today Brandy Cannon is ok to stop trazodone, and will continue to work on diet, exercise and weight loss efforts.   2. Other depression with emotional eating Behavior modification techniques were discussed today to help Brandy Cannon deal with her emotional/non-hunger eating behaviors. Brandy Cannon agreed to start Wellbutrin SR 150 mg q AM with no refills. Orders and follow up as documented in  patient record.   - buPROPion (WELLBUTRIN SR) 150 MG 12 hr tablet; Take 1 tablet (150 mg total) by mouth in the morning.  Dispense: 30 tablet; Refill: 0  3. At risk for impaired metabolic function Brandy Cannon was given approximately 15 minutes of impaired  metabolic function prevention counseling today. We discussed intensive lifestyle modifications today with an emphasis on specific nutrition and exercise instructions and strategies.   Repetitive spaced learning was employed today to elicit superior memory formation and behavioral change.  4. Obesity with current BMI of 46.8 Brandy Cannon is currently in the action stage of change. As such, her goal is to continue with weight loss efforts. She has agreed to keeping a food journal and adhering to recommended goals of 1600-1800 calories and 100 grams of protein daily.   Behavioral modification strategies: increasing lean protein intake.  Brandy Cannon has agreed to follow-up with our clinic in 2 to 3 weeks. She was informed of the importance of frequent follow-up visits to maximize her success with intensive lifestyle modifications for her multiple health conditions.   Objective:   Blood pressure 136/82, pulse 73, temperature 98.4 F (36.9 C), height 5\' 5"  (1.651 m), weight 281 lb (127.5 kg), SpO2 100 %. Body mass index is 46.76 kg/m.  General: Cooperative, alert, well developed, in no acute distress. HEENT: Conjunctivae and lids unremarkable. Cardiovascular: Regular rhythm.  Lungs: Normal work of breathing. Neurologic: No focal deficits.   Lab Results  Component Value Date   CREATININE 0.82 07/07/2020   BUN 9 07/07/2020   NA 141 07/07/2020   K 4.6 07/07/2020   CL 101 07/07/2020   CO2 24  07/07/2020   Lab Results  Component Value Date   ALT 22 07/07/2020   AST 19 07/07/2020   ALKPHOS 86 07/07/2020   BILITOT 0.4 07/07/2020   Lab Results  Component Value Date   HGBA1C 5.6 07/07/2020   Lab Results  Component Value Date   INSULIN 11.9  07/07/2020   Lab Results  Component Value Date   TSH 3.480 07/07/2020   Lab Results  Component Value Date   CHOL 125 07/07/2020   HDL 34 (L) 07/07/2020   LDLCALC 75 07/07/2020   TRIG 78 07/07/2020   Lab Results  Component Value Date   WBC 6.0 07/07/2020   HGB 12.9 07/07/2020   HCT 38.8 07/07/2020   MCV 90 07/07/2020   PLT 321 07/07/2020   No results found for: IRON, TIBC, FERRITIN  Attestation Statements:   Reviewed by clinician on day of visit: allergies, medications, problem list, medical history, surgical history, family history, social history, and previous encounter notes.   I, Burt Knack, am acting as transcriptionist for Quillian Quince, MD.  I have reviewed the above documentation for accuracy and completeness, and I agree with the above. -  Quillian Quince, MD

## 2020-10-07 ENCOUNTER — Other Ambulatory Visit (INDEPENDENT_AMBULATORY_CARE_PROVIDER_SITE_OTHER): Payer: Self-pay | Admitting: Family Medicine

## 2020-10-07 DIAGNOSIS — F3289 Other specified depressive episodes: Secondary | ICD-10-CM

## 2020-10-07 NOTE — Telephone Encounter (Signed)
Pt last seen by Dr. Beasley.  

## 2020-10-11 ENCOUNTER — Ambulatory Visit (INDEPENDENT_AMBULATORY_CARE_PROVIDER_SITE_OTHER): Payer: 59 | Admitting: Family Medicine

## 2020-10-11 ENCOUNTER — Other Ambulatory Visit (INDEPENDENT_AMBULATORY_CARE_PROVIDER_SITE_OTHER): Payer: Self-pay | Admitting: Family Medicine

## 2020-10-11 DIAGNOSIS — F3289 Other specified depressive episodes: Secondary | ICD-10-CM

## 2020-10-11 NOTE — Telephone Encounter (Signed)
Dr.Beasley 

## 2020-10-12 NOTE — Telephone Encounter (Signed)
Patient is requesting a refill of the following medications: Requested Prescriptions   Pending Prescriptions Disp Refills   buPROPion (WELLBUTRIN SR) 150 MG 12 hr tablet [Pharmacy Med Name: BUPROPION HCL SR 150 MG TABLET] 30 tablet 0    Sig: Take 1 tablet (150 mg total) by mouth in the morning.     Last office visit: 09/15/20 Date of last refill: 09/15/20 Last refill amount: 30 Follow up time period per chart: 3 week Next scheduled appt: 11/10/20

## 2020-10-12 NOTE — Telephone Encounter (Signed)
Ok x 1

## 2020-10-17 ENCOUNTER — Other Ambulatory Visit (INDEPENDENT_AMBULATORY_CARE_PROVIDER_SITE_OTHER): Payer: Self-pay | Admitting: Family Medicine

## 2020-10-17 DIAGNOSIS — E559 Vitamin D deficiency, unspecified: Secondary | ICD-10-CM

## 2020-10-18 MED ORDER — VITAMIN D (ERGOCALCIFEROL) 1.25 MG (50000 UNIT) PO CAPS: 50000.0000 [IU] | ORAL_CAPSULE | ORAL | 0 refills | Status: DC

## 2020-10-18 NOTE — Telephone Encounter (Signed)
Patient is requesting a refill of the following medications: Requested Prescriptions   Pending Prescriptions Disp Refills   Vitamin D, Ergocalciferol, (DRISDOL) 1.25 MG (50000 UNIT) CAPS capsule 10 capsule 0    Sig: Take 1 capsule (50,000 Units total) by mouth every 3 (three) days.     Last office visit: 09/15/20 Date of last refill: 09/01/20 Last refill amount: 10 Follow up time period per chart: 3 weeks Next appt scheduled: 11/10/20

## 2020-10-18 NOTE — Telephone Encounter (Signed)
Ok x 1

## 2020-11-08 ENCOUNTER — Other Ambulatory Visit (INDEPENDENT_AMBULATORY_CARE_PROVIDER_SITE_OTHER): Payer: Self-pay | Admitting: Family Medicine

## 2020-11-08 DIAGNOSIS — F3289 Other specified depressive episodes: Secondary | ICD-10-CM

## 2020-11-08 NOTE — Telephone Encounter (Signed)
Dr.Beasley 

## 2020-11-10 ENCOUNTER — Other Ambulatory Visit: Payer: Self-pay

## 2020-11-10 ENCOUNTER — Encounter (INDEPENDENT_AMBULATORY_CARE_PROVIDER_SITE_OTHER): Payer: Self-pay | Admitting: Family Medicine

## 2020-11-10 ENCOUNTER — Ambulatory Visit (INDEPENDENT_AMBULATORY_CARE_PROVIDER_SITE_OTHER): Payer: 59 | Admitting: Family Medicine

## 2020-11-10 ENCOUNTER — Other Ambulatory Visit (INDEPENDENT_AMBULATORY_CARE_PROVIDER_SITE_OTHER): Payer: Self-pay | Admitting: Family Medicine

## 2020-11-10 VITALS — BP 125/77 | HR 81 | Temp 98.2°F | Ht 65.0 in | Wt 271.0 lb

## 2020-11-10 DIAGNOSIS — E559 Vitamin D deficiency, unspecified: Secondary | ICD-10-CM

## 2020-11-10 DIAGNOSIS — F3289 Other specified depressive episodes: Secondary | ICD-10-CM | POA: Diagnosis not present

## 2020-11-10 DIAGNOSIS — Z6841 Body Mass Index (BMI) 40.0 and over, adult: Secondary | ICD-10-CM

## 2020-11-10 DIAGNOSIS — Z9189 Other specified personal risk factors, not elsewhere classified: Secondary | ICD-10-CM

## 2020-11-10 MED ORDER — VITAMIN D (ERGOCALCIFEROL) 1.25 MG (50000 UNIT) PO CAPS: 50000.0000 [IU] | ORAL_CAPSULE | ORAL | 0 refills | Status: DC

## 2020-11-10 MED ORDER — BUPROPION HCL ER (SR) 150 MG PO TB12: 150.0000 mg | ORAL_TABLET | Freq: Every morning | ORAL | 0 refills | Status: DC

## 2020-11-15 NOTE — Progress Notes (Signed)
Chief Complaint:   OBESITY Brandy Cannon is here to discuss her progress with her obesity treatment plan along with follow-up of her obesity related diagnoses. Brandy Cannon is on keeping a food journal and adhering to recommended goals of 1600-1800 calories and 100 grams of protein daily and states she is following her eating plan approximately 75% of the time. Brandy Cannon states she is doing 0 minutes 0 times per week.  Today's visit was #: 7 Starting weight: 296 lbs Starting date: 07/07/2020 Today's weight: 271 lbs Today's date: 11/10/2020 Total lbs lost to date: 25 Total lbs lost since last in-office visit: 10  Interim History: Brandy Cannon has done well with weight loss in the last 2 months. She is working on Orthoptist and meeting her calorie and protein goals. She is in the process of moving and she has a new puppy, so her life is very hectic right now.  Subjective:   1. Vitamin D deficiency Clorine is stable on Vit D, but her level is not yet at goal.  2. Other depression with emotional eating Brandy Cannon is doing well on bupropion. She has increased stress right now but she feels she is dealing with this better.  3. At risk for dehydration Brandy Cannon is at risk for dehydration due to inadequate water intake.  Assessment/Plan:   1. Vitamin D deficiency Low Vitamin D level contributes to fatigue and are associated with obesity, breast, and colon cancer. We will refill prescription Vitamin D for 1 month, and we will recheck labs in 1 month. Brandy Cannon will follow-up for routine testing of Vitamin D, at least 2-3 times per year to avoid over-replacement.  - Vitamin D, Ergocalciferol, (DRISDOL) 1.25 MG (50000 UNIT) CAPS capsule; Take 1 capsule (50,000 Units total) by mouth every 3 (three) days.  Dispense: 10 capsule; Refill: 0  2. Other depression with emotional eating Behavior modification techniques were discussed today to help Brandy Cannon deal with her emotional/non-hunger eating behaviors. We will refill  Wellbutrin SR for 1 month. Orders and follow up as documented in patient record.   - buPROPion (WELLBUTRIN SR) 150 MG 12 hr tablet; Take 1 tablet (150 mg total) by mouth in the morning.  Dispense: 30 tablet; Refill: 0  3. At risk for dehydration Brandy Cannon was given approximately 15 minutes dehydration prevention counseling today. Brandy Cannon is at risk for dehydration due to weight loss and current medication(s). She was encouraged to hydrate and monitor fluid status to avoid dehydration as well as weight loss plateaus.   4. Obesity with current BMI 45.1 Brandy Cannon is currently in the action stage of change. As such, her goal is to continue with weight loss efforts. She has agreed to keeping a food journal and adhering to recommended goals of 1600-1800 calories and 100+ grams of protein daily.   Behavioral modification strategies: increasing water intake and no skipping meals.  Brandy Cannon has agreed to follow-up with our clinic in 4 weeks. She was informed of the importance of frequent follow-up visits to maximize her success with intensive lifestyle modifications for her multiple health conditions.   Objective:   Blood pressure 125/77, pulse 81, temperature 98.2 F (36.8 C), height 5\' 5"  (1.651 m), weight 271 lb (122.9 kg), SpO2 96 %. Body mass index is 45.1 kg/m.  General: Cooperative, alert, well developed, in no acute distress. HEENT: Conjunctivae and lids unremarkable. Cardiovascular: Regular rhythm.  Lungs: Normal work of breathing. Neurologic: No focal deficits.   Lab Results  Component Value Date   CREATININE 0.82 07/07/2020  BUN 9 07/07/2020   NA 141 07/07/2020   K 4.6 07/07/2020   CL 101 07/07/2020   CO2 24 07/07/2020   Lab Results  Component Value Date   ALT 22 07/07/2020   AST 19 07/07/2020   ALKPHOS 86 07/07/2020   BILITOT 0.4 07/07/2020   Lab Results  Component Value Date   HGBA1C 5.6 07/07/2020   Lab Results  Component Value Date   INSULIN 11.9 07/07/2020   Lab  Results  Component Value Date   TSH 3.480 07/07/2020   Lab Results  Component Value Date   CHOL 125 07/07/2020   HDL 34 (L) 07/07/2020   LDLCALC 75 07/07/2020   TRIG 78 07/07/2020   Lab Results  Component Value Date   WBC 6.0 07/07/2020   HGB 12.9 07/07/2020   HCT 38.8 07/07/2020   MCV 90 07/07/2020   PLT 321 07/07/2020   No results found for: IRON, TIBC, FERRITIN  Attestation Statements:   Reviewed by clinician on day of visit: allergies, medications, problem list, medical history, surgical history, family history, social history, and previous encounter notes.   I, Burt Knack, am acting as transcriptionist for Quillian Quince, MD.  I have reviewed the above documentation for accuracy and completeness, and I agree with the above. -  Quillian Quince, MD

## 2020-12-06 ENCOUNTER — Other Ambulatory Visit (INDEPENDENT_AMBULATORY_CARE_PROVIDER_SITE_OTHER): Payer: Self-pay | Admitting: Family Medicine

## 2020-12-06 DIAGNOSIS — F3289 Other specified depressive episodes: Secondary | ICD-10-CM

## 2020-12-06 NOTE — Telephone Encounter (Signed)
Pt last seen by Dr. Beasley.  

## 2020-12-11 ENCOUNTER — Other Ambulatory Visit (INDEPENDENT_AMBULATORY_CARE_PROVIDER_SITE_OTHER): Payer: Self-pay | Admitting: Family Medicine

## 2020-12-11 DIAGNOSIS — F3289 Other specified depressive episodes: Secondary | ICD-10-CM

## 2020-12-13 NOTE — Telephone Encounter (Signed)
LAST APPOINTMENT DATE: 12/06/2020  NEXT APPOINTMENT DATE: 12/14/2020   [id number] - [place], [place] - [address] [place]  [address] Phone: [phone] Fax: [phone]  [id number] - [place] [facility] - [address] [place] [address] Phone: [phone] Fax: [phone]  [id number] - [place],  - [address] [place] [address] Phone: [phone] Fax: [phone]  Patient is requesting a refill of the following medications: Requested Prescriptions   Pending Prescriptions Disp Refills   buPROPion (WELLBUTRIN SR) 150 MG 12 hr tablet [Pharmacy Med Name: BUPROPION HCL SR 150 MG TABLET] 90 tablet 1    Sig: Take 1 tablet (150 mg total) by mouth in the morning.   REQUESTING 90 day supply Date last filled: 11/10/20 Previously prescribed by [facility]  Lab Results  Component Value Date   HGBA1C 5.6 07/07/2020   Lab Results  Component Value Date   LDLCALC 75 07/07/2020   CREATININE 0.82 07/07/2020   Lab Results  Component Value Date   VD25OH 4.4 (L) 07/07/2020    BP Readings from Last 3 Encounters:  11/10/20 125/77  09/15/20 136/82  09/01/20 123/79

## 2020-12-14 ENCOUNTER — Other Ambulatory Visit: Payer: Self-pay

## 2020-12-14 ENCOUNTER — Encounter (INDEPENDENT_AMBULATORY_CARE_PROVIDER_SITE_OTHER): Payer: Self-pay | Admitting: Family Medicine

## 2020-12-14 ENCOUNTER — Ambulatory Visit (INDEPENDENT_AMBULATORY_CARE_PROVIDER_SITE_OTHER): Payer: 59 | Admitting: Family Medicine

## 2020-12-14 VITALS — BP 139/93 | HR 76 | Temp 98.1°F | Ht 65.0 in | Wt 273.0 lb

## 2020-12-14 DIAGNOSIS — Z6841 Body Mass Index (BMI) 40.0 and over, adult: Secondary | ICD-10-CM

## 2020-12-14 DIAGNOSIS — Z9189 Other specified personal risk factors, not elsewhere classified: Secondary | ICD-10-CM | POA: Diagnosis not present

## 2020-12-14 DIAGNOSIS — F3289 Other specified depressive episodes: Secondary | ICD-10-CM

## 2020-12-14 DIAGNOSIS — E782 Mixed hyperlipidemia: Secondary | ICD-10-CM

## 2020-12-14 DIAGNOSIS — I10 Essential (primary) hypertension: Secondary | ICD-10-CM | POA: Diagnosis not present

## 2020-12-14 DIAGNOSIS — E559 Vitamin D deficiency, unspecified: Secondary | ICD-10-CM | POA: Diagnosis not present

## 2020-12-14 DIAGNOSIS — E8881 Metabolic syndrome: Secondary | ICD-10-CM | POA: Diagnosis not present

## 2020-12-14 MED ORDER — BUPROPION HCL ER (SR) 200 MG PO TB12: 200.0000 mg | ORAL_TABLET | Freq: Every morning | ORAL | 0 refills | Status: DC

## 2020-12-14 MED ORDER — VENLAFAXINE HCL 75 MG PO TABS: 75.0000 mg | ORAL_TABLET | Freq: Every day | ORAL | 0 refills | Status: AC

## 2020-12-14 MED ORDER — VITAMIN D (ERGOCALCIFEROL) 1.25 MG (50000 UNIT) PO CAPS: 50000.0000 [IU] | ORAL_CAPSULE | ORAL | 0 refills | Status: DC

## 2020-12-14 MED ORDER — BUPROPION HCL ER (SR) 150 MG PO TB12: 150.0000 mg | ORAL_TABLET | Freq: Every morning | ORAL | 0 refills | Status: DC

## 2020-12-15 LAB — LIPID PANEL WITH LDL/HDL RATIO
Cholesterol, Total: 136 mg/dL (ref 100–199)
HDL: 40 mg/dL (ref >39)
LDL Chol Calc (NIH): 78 mg/dL (ref 0–99)
LDL/HDL Ratio: 2 ratio (ref 0.0–3.2)
Triglycerides: 95 mg/dL (ref 0–149)
VLDL Cholesterol Cal: 18 mg/dL (ref 5–40)

## 2020-12-15 LAB — CMP14+EGFR
ALT: 20 IU/L (ref 0–32)
AST: 19 IU/L (ref 0–40)
Albumin/Globulin Ratio: 2.1 (ref 1.2–2.2)
Albumin: 4.7 g/dL (ref 3.8–4.9)
Alkaline Phosphatase: 84 IU/L (ref 44–121)
BUN/Creatinine Ratio: 12 (ref 9–23)
BUN: 10 mg/dL (ref 6–24)
Bilirubin Total: 0.2 mg/dL (ref 0.0–1.2)
CO2: 24 mmol/L (ref 20–29)
Calcium: 9.3 mg/dL (ref 8.7–10.2)
Chloride: 103 mmol/L (ref 96–106)
Creatinine, Ser: 0.86 mg/dL (ref 0.57–1.00)
Globulin, Total: 2.2 g/dL (ref 1.5–4.5)
Glucose: 91 mg/dL (ref 65–99)
Potassium: 4.1 mmol/L (ref 3.5–5.2)
Sodium: 141 mmol/L (ref 134–144)
Total Protein: 6.9 g/dL (ref 6.0–8.5)
eGFR: 80 mL/min/{1.73_m2} (ref >59)

## 2020-12-15 LAB — HEMOGLOBIN A1C
Est. average glucose Bld gHb Est-mCnc: 114 mg/dL
Hgb A1c MFr Bld: 5.6 % (ref 4.8–5.6)

## 2020-12-15 LAB — INSULIN, RANDOM: INSULIN: 10.3 u[IU]/mL (ref 2.6–24.9)

## 2020-12-15 LAB — VITAMIN D 25 HYDROXY (VIT D DEFICIENCY, FRACTURES): Vit D, 25-Hydroxy: 68 ng/mL (ref 30.0–100.0)

## 2020-12-22 NOTE — Progress Notes (Signed)
Chief Complaint:   OBESITY Brandy Cannon is here to discuss her progress with her obesity treatment plan along with follow-up of her obesity related diagnoses. Brandy Cannon is on keeping a food journal and adhering to recommended goals of 1600-1800 calories and 100+ grams of protein daily and states she is following her eating plan approximately 20% of the time. Brandy Cannon states she is doing 0 minutes 0 times per week.  Today's visit was #: 8 Starting weight: 296 lbs Starting date: 07/07/2020 Today's weight: 273 lbs Today's date: 12/14/2020 Total lbs lost to date: 23 Total lbs lost since last in-office visit: 0  Interim History: Brandy Cannon has been very busy with moving this week and she has been completely off of her routine. She has been unable to plan ahead as well and has done more grab and go eating. Her stress level is very high.  Subjective:   1. Vitamin D deficiency Brandy Cannon's last Vit D level was very low. She is on a high dose Vit D and she is due to have labs checked.  2. Mixed hyperlipidemia Brandy Cannon is working on diet and exercise. She denies chest pain and she is due for labs.  3. Insulin resistance Brandy Cannon is working on diet and exercise, and she is due for labs.  4. Essential hypertension Brandy Cannon didn't take her blood pressure medications this morning due to fasting. Her blood pressure is above goal.  5. Other depression with emotional eating Brandy Cannon's stress level has been very high while in the process of moving. She tried to down her dose of Wellbutrin for a few days to help with this. She notes worsening insomnia and feeling fatigued.  6. At risk for heart disease Brandy Cannon is at a higher than average risk for cardiovascular disease due to obesity.   Assessment/Plan:   1. Vitamin D deficiency Low Vitamin D level contributes to fatigue and are associated with obesity, breast, and colon cancer. We will check labs today, and we will refill prescription Vitamin D for 1 month. [name] will  follow-up for routine testing of Vitamin D, at least 2-3 times per year to avoid over-replacement.  - Vitamin D, Ergocalciferol, (DRISDOL) 1.25 MG (50000 UNIT) CAPS capsule; Take 1 capsule (50,000 Units total) by mouth every 3 (three) days.  Dispense: 10 capsule; Refill: 0 - VITAMIN D 25 Hydroxy (Vit-D Deficiency, Fractures)  2. Mixed hyperlipidemia Cardiovascular risk and specific lipid/LDL goals reviewed. We discussed several lifestyle modifications today. We will check labs today. [name] will continue to work on diet, exercise and weight loss efforts. Orders and follow up as documented in patient record.   - Lipid Panel With LDL/HDL Ratio  3. Insulin resistance Brandy Cannon will continue to work on weight loss, exercise, and decreasing simple carbohydrates to help decrease the risk of diabetes. We will check labs today. Brandy Cannon agreed to follow-up with [place] as directed to closely monitor her progress.  - CMP14+EGFR - Insulin, random - Hemoglobin A1c  4. Essential hypertension [name] will continue working on diet and exercise to improve blood pressure control. We will watch for signs of hypotension as she continues her lifestyle modifications.  5. Other depression with emotional eating Behavior modification techniques were discussed today to help Brandy Cannon deal with her emotional/non-hunger eating behaviors. Apple agreed to increase Wellbutrin SR to 200 mg  q AM with no refills, and we will refill Effexor for 1 month. She was advised that her worsening insomnia was likely due to the jump in Wellbutrin dose, and we will  continue to follow closely. Orders and follow up as documented in patient record.   - venlafaxine (EFFEXOR) 75 MG tablet; Take 1 tablet (75 mg total) by mouth daily.  Dispense: 30 tablet; Refill: 0 - buPROPion (WELLBUTRIN SR) 200 MG 12 hr tablet; Take 1 tablet (200 mg total) by mouth every morning.  Dispense: 30 tablet; Refill: 0  6. At risk for heart disease Brandy Cannon was given  approximately 15 minutes of coronary artery disease prevention counseling today. She is 55 y.o. female and has risk factors for heart disease including obesity. We discussed intensive lifestyle modifications today with an emphasis on specific weight loss instructions and strategies.   Repetitive spaced learning was employed today to elicit superior memory formation and behavioral change.  7. Obesity with current BMI 45.5 Brandy Cannon is currently in the action stage of change. As such, her goal is to continue with weight loss efforts. She has agreed to keeping a food journal and adhering to recommended goals of 1600-1800 calories and 100+ grams of protein daily.   Behavioral modification strategies: increasing lean protein intake and emotional eating strategies.  Brandy Cannon has agreed to follow-up with our clinic in 3 weeks. She was informed of the importance of frequent follow-up visits to maximize her success with intensive lifestyle modifications for her multiple health conditions.   Brandy Cannon was informed we would discuss her lab results at her next visit unless there is a critical issue that needs to be addressed sooner. Brandy Cannon agreed to keep her next visit at the agreed upon time to discuss these results.  Objective:   Blood pressure (!) 139/93, pulse 76, temperature 98.1 F (36.7 C), height '5\' 5"'  (1.651 m), weight 273 lb (123.8 kg), SpO2 97 %. Body mass index is 45.43 kg/m.  General: Cooperative, alert, well developed, in no acute distress. HEENT: Conjunctivae and lids unremarkable. Cardiovascular: Regular rhythm.  Lungs: Normal work of breathing. Neurologic: No focal deficits.   Lab Results  Component Value Date   CREATININE 0.86 12/14/2020   BUN 10 12/14/2020   NA 141 12/14/2020   K 4.1 12/14/2020   CL 103 12/14/2020   CO2 24 12/14/2020   Lab Results  Component Value Date   ALT 20 12/14/2020   AST 19 12/14/2020   ALKPHOS 84 12/14/2020   BILITOT 0.2 12/14/2020   Lab Results   Component Value Date   HGBA1C 5.6 12/14/2020   HGBA1C 5.6 07/07/2020   Lab Results  Component Value Date   INSULIN 10.3 12/14/2020   INSULIN 11.9 07/07/2020   Lab Results  Component Value Date   TSH 3.480 07/07/2020   Lab Results  Component Value Date   CHOL 136 12/14/2020   HDL 40 12/14/2020   LDLCALC 78 12/14/2020   TRIG 95 12/14/2020   Lab Results  Component Value Date   VD25OH 68.0 12/14/2020   VD25OH 4.4 (L) 07/07/2020   Lab Results  Component Value Date   WBC 6.0 07/07/2020   HGB 12.9 07/07/2020   HCT 38.8 07/07/2020   MCV 90 07/07/2020   PLT 321 07/07/2020   No results found for: IRON, TIBC, FERRITIN  Attestation Statements:   Reviewed by clinician on day of visit: allergies, medications, problem list, medical history, surgical history, family history, social history, and previous encounter notes.   I, Trixie Dredge, am acting as transcriptionist for Dennard Nip, MD.  I have reviewed the above documentation for accuracy and completeness, and I agree with the above. -  Dennard Nip, MD

## 2021-01-05 ENCOUNTER — Other Ambulatory Visit: Payer: Self-pay | Admitting: Family Medicine

## 2021-01-05 ENCOUNTER — Other Ambulatory Visit (INDEPENDENT_AMBULATORY_CARE_PROVIDER_SITE_OTHER): Payer: Self-pay | Admitting: Family Medicine

## 2021-01-05 DIAGNOSIS — F3289 Other specified depressive episodes: Secondary | ICD-10-CM

## 2021-01-05 DIAGNOSIS — E559 Vitamin D deficiency, unspecified: Secondary | ICD-10-CM

## 2021-01-05 DIAGNOSIS — Z1231 Encounter for screening mammogram for malignant neoplasm of breast: Secondary | ICD-10-CM

## 2021-01-05 NOTE — Telephone Encounter (Signed)
Last OV with Dr. Beasley 

## 2021-01-09 ENCOUNTER — Other Ambulatory Visit (INDEPENDENT_AMBULATORY_CARE_PROVIDER_SITE_OTHER): Payer: Self-pay | Admitting: Family Medicine

## 2021-01-09 DIAGNOSIS — F3289 Other specified depressive episodes: Secondary | ICD-10-CM

## 2021-01-10 ENCOUNTER — Encounter (INDEPENDENT_AMBULATORY_CARE_PROVIDER_SITE_OTHER): Payer: Self-pay | Admitting: Emergency Medicine

## 2021-01-10 NOTE — Telephone Encounter (Signed)
Mychart message sent.

## 2021-01-11 ENCOUNTER — Ambulatory Visit
Admission: RE | Admit: 2021-01-11 | Discharge: 2021-01-11 | Disposition: A | Discharge status: Home / Self Care | Payer: 59 | Source: Ambulatory Visit

## 2021-01-11 ENCOUNTER — Other Ambulatory Visit: Payer: Self-pay

## 2021-01-11 DIAGNOSIS — Z1231 Encounter for screening mammogram for malignant neoplasm of breast: Secondary | ICD-10-CM

## 2021-01-13 ENCOUNTER — Other Ambulatory Visit (INDEPENDENT_AMBULATORY_CARE_PROVIDER_SITE_OTHER): Payer: Self-pay | Admitting: Family Medicine

## 2021-01-13 DIAGNOSIS — F3289 Other specified depressive episodes: Secondary | ICD-10-CM

## 2021-01-18 ENCOUNTER — Other Ambulatory Visit (INDEPENDENT_AMBULATORY_CARE_PROVIDER_SITE_OTHER): Payer: Self-pay | Admitting: Emergency Medicine

## 2021-01-18 DIAGNOSIS — E559 Vitamin D deficiency, unspecified: Secondary | ICD-10-CM

## 2021-01-18 MED ORDER — VITAMIN D (ERGOCALCIFEROL) 1.25 MG (50000 UNIT) PO CAPS: 50000.0000 [IU] | ORAL_CAPSULE | ORAL | 0 refills | Status: DC

## 2021-01-19 ENCOUNTER — Encounter (INDEPENDENT_AMBULATORY_CARE_PROVIDER_SITE_OTHER): Payer: Self-pay

## 2021-01-20 ENCOUNTER — Other Ambulatory Visit: Payer: Self-pay

## 2021-01-20 ENCOUNTER — Ambulatory Visit (INDEPENDENT_AMBULATORY_CARE_PROVIDER_SITE_OTHER): Payer: 59 | Admitting: Family Medicine

## 2021-01-20 ENCOUNTER — Encounter (INDEPENDENT_AMBULATORY_CARE_PROVIDER_SITE_OTHER): Payer: Self-pay | Admitting: Family Medicine

## 2021-01-20 VITALS — BP 140/76 | HR 61 | Temp 98.2°F | Ht 65.0 in | Wt 270.0 lb

## 2021-01-20 DIAGNOSIS — Z6841 Body Mass Index (BMI) 40.0 and over, adult: Secondary | ICD-10-CM

## 2021-01-20 DIAGNOSIS — Z9189 Other specified personal risk factors, not elsewhere classified: Secondary | ICD-10-CM | POA: Diagnosis not present

## 2021-01-20 DIAGNOSIS — I10 Essential (primary) hypertension: Secondary | ICD-10-CM | POA: Diagnosis not present

## 2021-01-20 DIAGNOSIS — F3289 Other specified depressive episodes: Secondary | ICD-10-CM

## 2021-01-20 DIAGNOSIS — E559 Vitamin D deficiency, unspecified: Secondary | ICD-10-CM | POA: Diagnosis not present

## 2021-01-20 DIAGNOSIS — E66813 Obesity, class 3: Secondary | ICD-10-CM

## 2021-01-20 MED ORDER — VENLAFAXINE HCL ER 75 MG PO CP24: 75.0000 mg | ORAL_CAPSULE | Freq: Every day | ORAL | 0 refills | Status: AC

## 2021-01-20 MED ORDER — VITAMIN D (ERGOCALCIFEROL) 1.25 MG (50000 UNIT) PO CAPS: 50000.0000 [IU] | ORAL_CAPSULE | ORAL | 0 refills | Status: AC

## 2021-01-20 MED ORDER — BUPROPION HCL ER (SR) 200 MG PO TB12
200.0000 mg | ORAL_TABLET | Freq: Every morning | ORAL | 0 refills | Status: AC
Start: 2021-01-20 — End: ?

## 2021-01-20 NOTE — Progress Notes (Signed)
Chief Complaint:   OBESITY Brandy Cannon is here to discuss her progress with her obesity treatment plan along with follow-up of her obesity related diagnoses. Brandy Cannon is on keeping a food journal and adhering to recommended goals of 1600-1800 calories and 100+ grams of protein daily and states she is following her eating plan approximately 60% of the time. Brandy Cannon states she is doing 0 minutes 0 times per week.  Today's visit was #: 9 Starting weight: 296 lbs Starting date: 07/07/2020 Today's weight: 270 lbs Today's date: 01/20/2021 Total lbs lost to date: 26 Total lbs lost since last in-office visit: 3  Interim History: Brandy Cannon continues to do well with weight loss. She moved recently and she is still unpacking. She hasn't slept as well recently partly due to her puppy is starting to awake at night.  Subjective:   1. Essential hypertension Brandy Cannon's blood pressure is borderline elevated today, and just took her blood pressure medications approximately 1 hour ago.   2. Vitamin D deficiency Brandy Cannon is stable on Vit D, and her level is now at goal.  3. Other depression with emotional eating Brandy Cannon is stable on medications. She requests the Effexor to be in capsule form.   4. At risk for heart disease Brandy Cannon is at a higher than average risk for cardiovascular disease due to obesity.   Assessment/Plan:   1. Essential hypertension Brandy Cannon is to try to change her Benicar to PM and she will follow up as she continues her lifestyle modifications.   2. Vitamin D deficiency Low Vitamin D level contributes to fatigue and are associated with obesity, breast, and colon cancer. We will refill prescription Vitamin D for 1 month. Brandy Cannon will follow-up for routine testing of Vitamin D, at least 2-3 times per year to avoid over-replacement.  - Vitamin D, Ergocalciferol, (DRISDOL) 1.25 MG (50000 UNIT) CAPS capsule; Take 1 capsule (50,000 Units total) by mouth every 3 (three) days.  Dispense: 10 capsule;  Refill: 0  3. Other depression with emotional eating Behavior modification techniques were discussed today to help Brandy Cannon deal with her emotional/non-hunger eating behaviors. We will refill Wellbutrin SR and Effexor capsules for 1 month. Orders and follow up as documented in patient record.   - buPROPion (WELLBUTRIN SR) 200 MG 12 hr tablet; Take 1 tablet (200 mg total) by mouth every morning.  Dispense: 30 tablet; Refill: 0 - venlafaxine XR (EFFEXOR XR) 75 MG 24 hr capsule; Take 1 capsule (75 mg total) by mouth daily with breakfast.  Dispense: 30 capsule; Refill: 0  4. At risk for heart disease Brandy Cannon was given approximately 15 minutes of coronary artery disease prevention counseling today. She is 55 y.o. female and has risk factors for heart disease including obesity. We discussed intensive lifestyle modifications today with an emphasis on specific weight loss instructions and strategies.   Repetitive spaced learning was employed today to elicit superior memory formation and behavioral change.  5. Obesity with current BMI 45.0 Brandy Cannon is currently in the action stage of change. As such, her goal is to continue with weight loss efforts. She has agreed to keeping a food journal and adhering to recommended goals of 1600-1800 calories and 100+ grams of protein daily.   Brandy Cannon to work on helping her puppy sleep so she can sleep better.  Behavioral modification strategies: meal planning and cooking strategies.  Brandy Cannon has agreed to follow-up with our clinic in 3 to 4 weeks. She was informed of the importance of frequent follow-up visits to maximize  her success with intensive lifestyle modifications for her multiple health conditions.   Objective:   Blood pressure 140/76, pulse 61, temperature 98.2 F (36.8 C), height 5\' 5"  (1.651 m), weight 270 lb (122.5 kg), SpO2 97 %. Body mass index is 44.93 kg/m.  General: Cooperative, alert, well developed, in no acute distress. HEENT: Conjunctivae and  lids unremarkable. Cardiovascular: Regular rhythm.  Lungs: Normal work of breathing. Neurologic: No focal deficits.   Lab Results  Component Value Date   CREATININE 0.86 12/14/2020   BUN 10 12/14/2020   NA 141 12/14/2020   K 4.1 12/14/2020   CL 103 12/14/2020   CO2 24 12/14/2020   Lab Results  Component Value Date   ALT 20 12/14/2020   AST 19 12/14/2020   ALKPHOS 84 12/14/2020   BILITOT 0.2 12/14/2020   Lab Results  Component Value Date   HGBA1C 5.6 12/14/2020   HGBA1C 5.6 07/07/2020   Lab Results  Component Value Date   INSULIN 10.3 12/14/2020   INSULIN 11.9 07/07/2020   Lab Results  Component Value Date   TSH 3.480 07/07/2020   Lab Results  Component Value Date   CHOL 136 12/14/2020   HDL 40 12/14/2020   LDLCALC 78 12/14/2020   TRIG 95 12/14/2020   Lab Results  Component Value Date   VD25OH 68.0 12/14/2020   VD25OH 4.4 (L) 07/07/2020   Lab Results  Component Value Date   WBC 6.0 07/07/2020   HGB 12.9 07/07/2020   HCT 38.8 07/07/2020   MCV 90 07/07/2020   PLT 321 07/07/2020   No results found for: IRON, TIBC, FERRITIN  Attestation Statements:   Reviewed by clinician on day of visit: allergies, medications, problem list, medical history, surgical history, family history, social history, and previous encounter notes.   I, 09/04/2020, am acting as transcriptionist for Burt Knack, MD.  I have reviewed the above documentation for accuracy and completeness, and I agree with the above. -  Quillian Quince, MD

## 2021-02-09 ENCOUNTER — Other Ambulatory Visit (INDEPENDENT_AMBULATORY_CARE_PROVIDER_SITE_OTHER): Payer: Self-pay | Admitting: Family Medicine

## 2021-02-09 DIAGNOSIS — E559 Vitamin D deficiency, unspecified: Secondary | ICD-10-CM

## 2021-02-09 NOTE — Telephone Encounter (Signed)
LAST APPOINTMENT DATE: 01/20/2021 NEXT APPOINTMENT DATE: 02/17/2021   [id number] - [place], [place] - [address] [place] [address] Phone: [phone] Fax: [phone]  Patient is requesting a refill of the following medications: Requested Prescriptions   Pending Prescriptions Disp Refills   Vitamin D, Ergocalciferol, (DRISDOL) 1.25 MG (50000 UNIT) CAPS capsule [Pharmacy Med Name: VITAMIN D2 1.25MG (50,000 UNIT)] 10 capsule 0    Sig: TAKE 1 CAPSULE BY MOUTH EVERY 3 DAYS    Date last filled: 01/20/2021 Previously prescribed by Dr. Dalbert Garnet  Lab Results  Component Value Date   HGBA1C 5.6 12/14/2020   HGBA1C 5.6 07/07/2020   Lab Results  Component Value Date   LDLCALC 78 12/14/2020   CREATININE 0.86 12/14/2020   Lab Results  Component Value Date   VD25OH 68.0 12/14/2020   VD25OH 4.4 (L) 07/07/2020    BP Readings from Last 3 Encounters:  01/20/21 140/76  12/14/20 (!) 139/93  11/10/20 125/77

## 2021-02-17 ENCOUNTER — Ambulatory Visit (INDEPENDENT_AMBULATORY_CARE_PROVIDER_SITE_OTHER): Payer: 59 | Admitting: Family Medicine

## 2021-02-26 ENCOUNTER — Other Ambulatory Visit (INDEPENDENT_AMBULATORY_CARE_PROVIDER_SITE_OTHER): Payer: Self-pay | Admitting: Family Medicine

## 2021-02-26 DIAGNOSIS — F3289 Other specified depressive episodes: Secondary | ICD-10-CM

## 2021-03-12 ENCOUNTER — Other Ambulatory Visit (INDEPENDENT_AMBULATORY_CARE_PROVIDER_SITE_OTHER): Payer: Self-pay | Admitting: Family Medicine

## 2021-03-12 DIAGNOSIS — E559 Vitamin D deficiency, unspecified: Secondary | ICD-10-CM

## 2021-10-06 ENCOUNTER — Other Ambulatory Visit: Payer: Self-pay | Admitting: Family Medicine

## 2021-10-06 DIAGNOSIS — R519 Headache, unspecified: Secondary | ICD-10-CM

## 2021-10-22 ENCOUNTER — Ambulatory Visit
Admission: RE | Admit: 2021-10-22 | Discharge: 2021-10-22 | Disposition: A | Discharge status: Home / Self Care | Payer: 59 | Source: Ambulatory Visit | Attending: Family Medicine | Admitting: Family Medicine

## 2021-10-22 DIAGNOSIS — R519 Headache, unspecified: Secondary | ICD-10-CM

## 2021-10-22 MED ORDER — GADOBENATE DIMEGLUMINE 529 MG/ML IV SOLN
20.0000 mL | Freq: Once | INTRAVENOUS | Status: AC | PRN
  Administered 2021-10-22: 20 mL via INTRAVENOUS

## 2021-12-22 ENCOUNTER — Other Ambulatory Visit: Payer: Self-pay | Admitting: Family Medicine

## 2021-12-22 DIAGNOSIS — Z1231 Encounter for screening mammogram for malignant neoplasm of breast: Secondary | ICD-10-CM

## 2022-01-04 ENCOUNTER — Encounter (INDEPENDENT_AMBULATORY_CARE_PROVIDER_SITE_OTHER): Payer: Self-pay

## 2022-01-12 ENCOUNTER — Ambulatory Visit
Admission: RE | Admit: 2022-01-12 | Discharge: 2022-01-12 | Disposition: A | Discharge status: Home / Self Care | Payer: 59 | Source: Ambulatory Visit | Attending: Family Medicine | Admitting: Family Medicine

## 2022-01-12 DIAGNOSIS — Z1231 Encounter for screening mammogram for malignant neoplasm of breast: Secondary | ICD-10-CM

## 2022-03-07 IMAGING — MG DIGITAL SCREENING BILAT W/ TOMO W/ CAD
8 series · 8 of 24 positions shown · non-contrast
Comparison: Previous exam(s).

CLINICAL DATA: Screening.

EXAM:
DIGITAL SCREENING BILATERAL MAMMOGRAM WITH TOMO AND CAD

[L CC synth-2D]
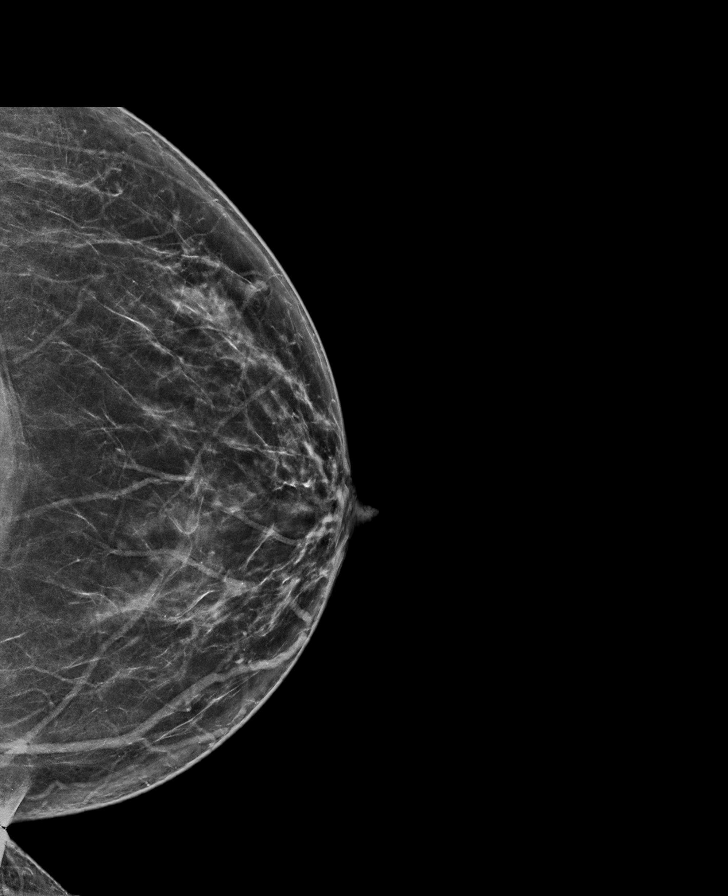

[R MLO synth-2D]
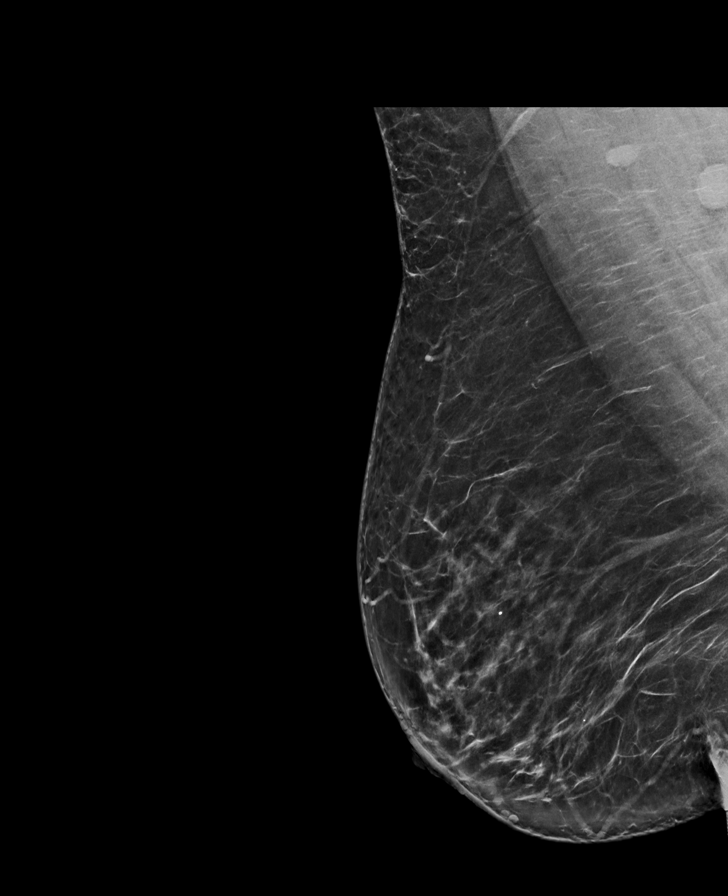

[L MLO synth-2D]
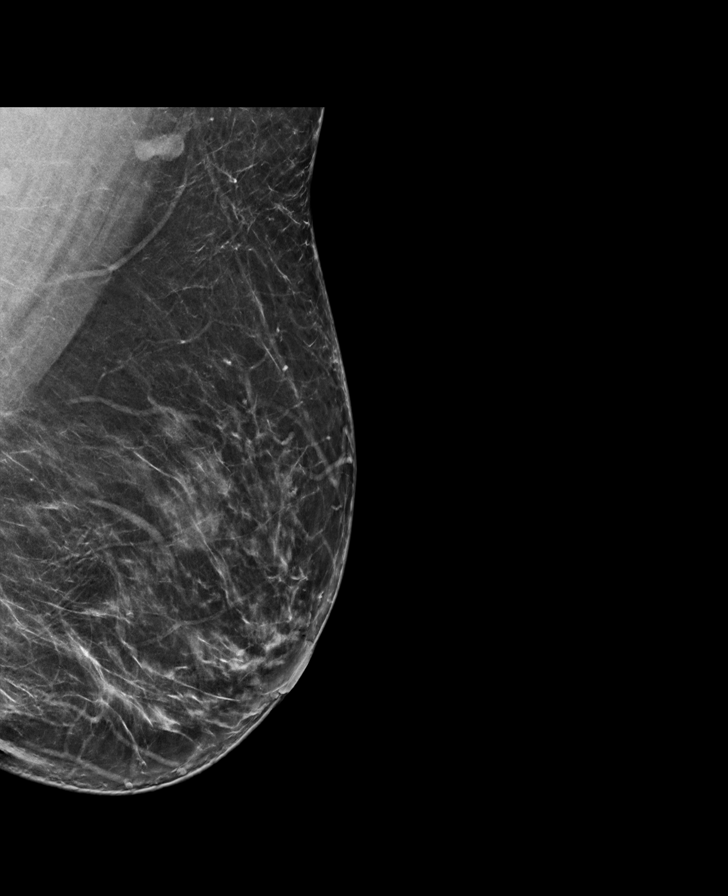

[R CC synth-2D]
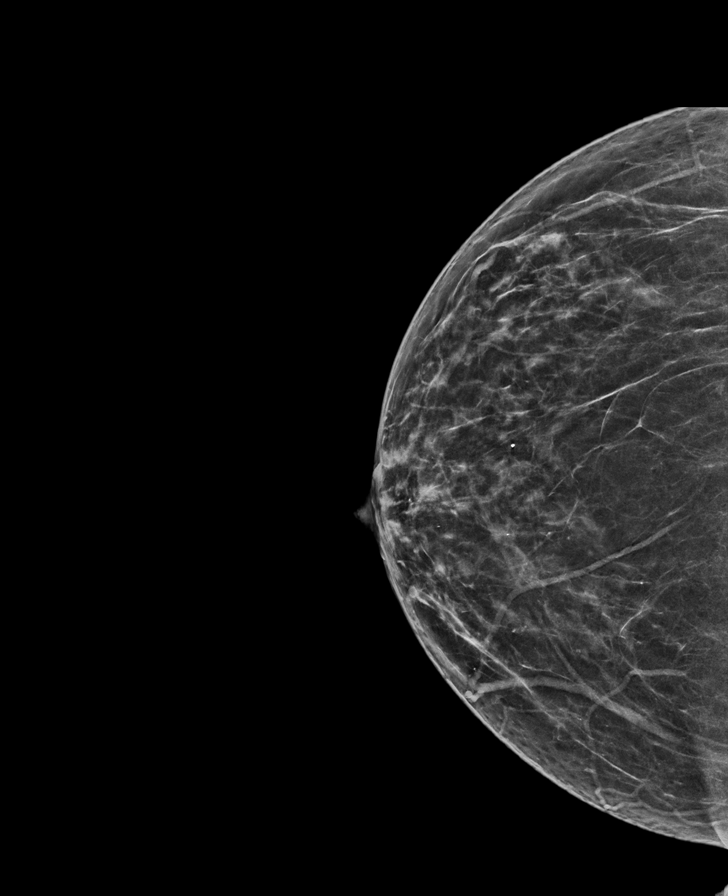

[R CC tomo · tomo slice 33/64.0]
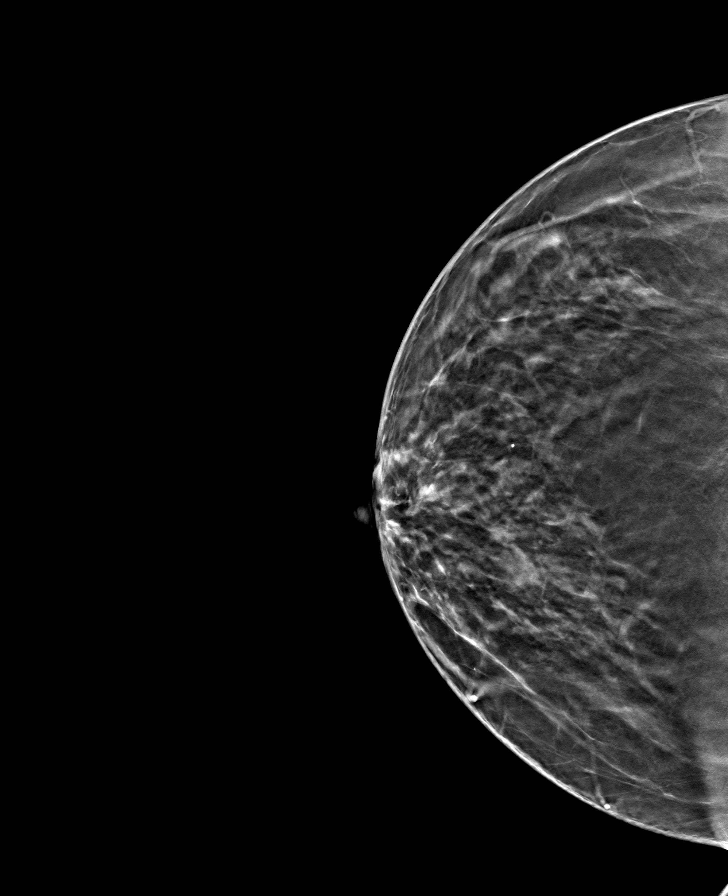

[R MLO tomo · tomo slice 44/87.0]
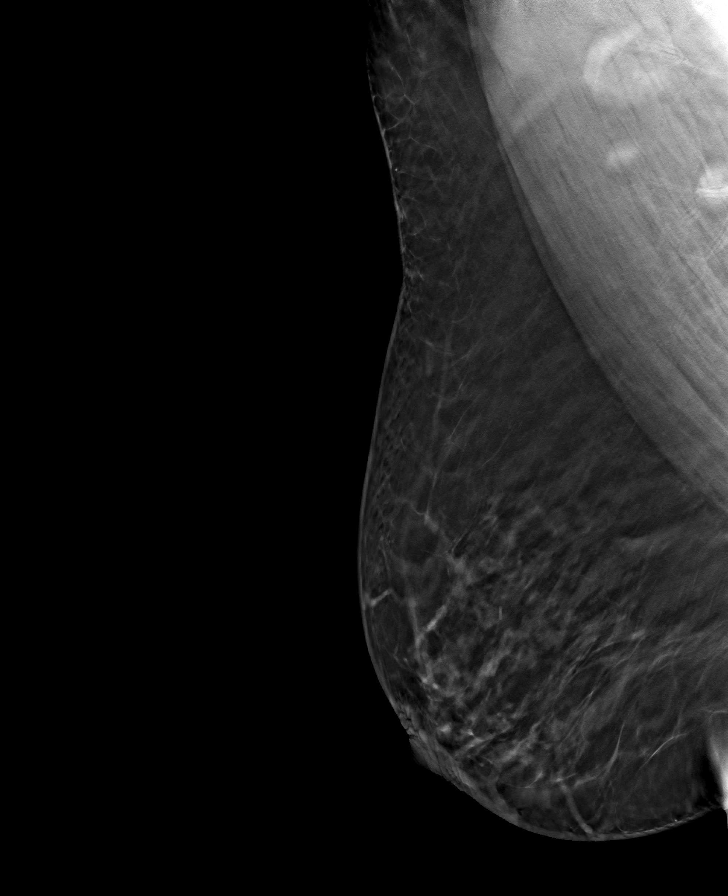

[L CC tomo · tomo slice 37/72.0]
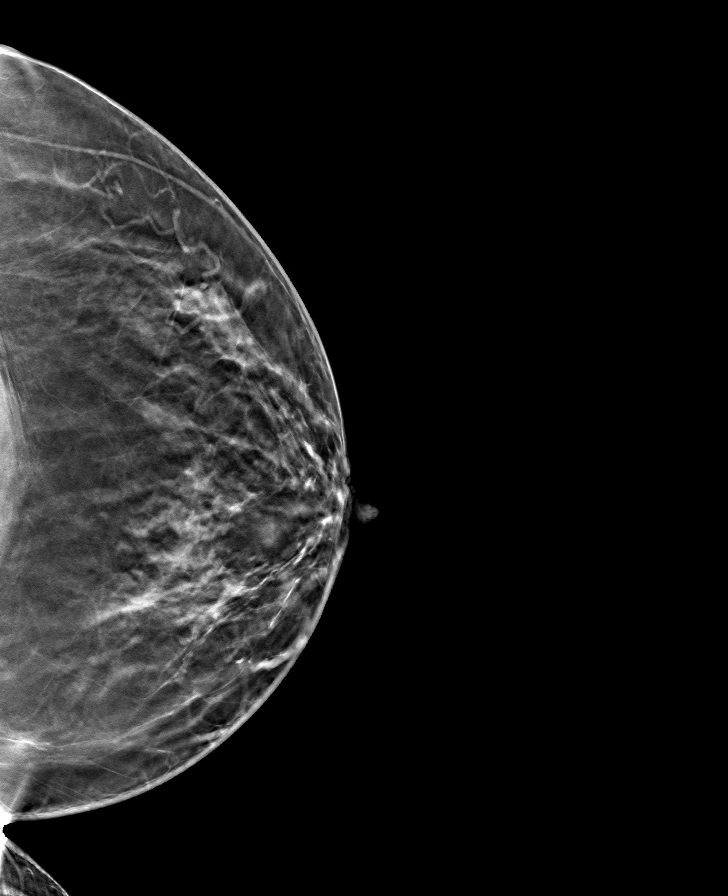

[L MLO tomo · tomo slice 41/81.0]
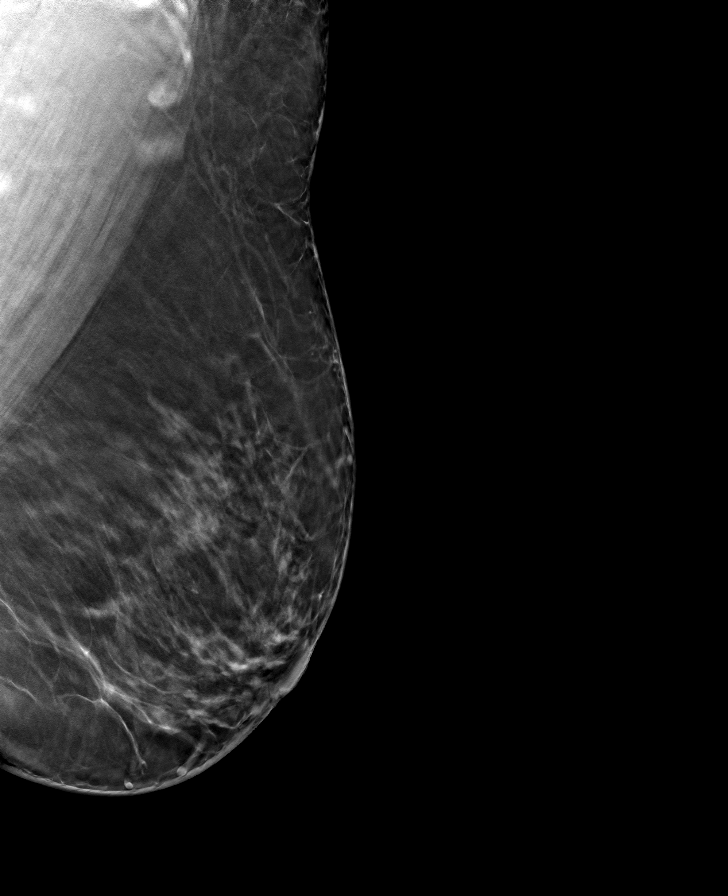

[8 of 24 positions shown; findings below may reference images not displayed]

ACR Breast Density Category b: There are scattered areas of
fibroglandular density.
FINDINGS: There are no findings suspicious for malignancy. Images were
processed with CAD.
IMPRESSION: No mammographic evidence of malignancy. A result letter of this
screening mammogram will be mailed directly to the patient.

RECOMMENDATION:
Screening mammogram in one year. (Code:[id number])

BI-RADS CATEGORY  1: Negative.

## 2022-11-27 ENCOUNTER — Other Ambulatory Visit: Payer: Self-pay | Admitting: Family Medicine

## 2022-11-27 DIAGNOSIS — Z1231 Encounter for screening mammogram for malignant neoplasm of breast: Secondary | ICD-10-CM

## 2023-01-15 ENCOUNTER — Ambulatory Visit: Admission: RE | Admit: 2023-01-15 | Payer: 59 | Source: Ambulatory Visit

## 2023-01-15 ENCOUNTER — Ambulatory Visit: Payer: 59

## 2023-01-15 DIAGNOSIS — Z1231 Encounter for screening mammogram for malignant neoplasm of breast: Secondary | ICD-10-CM

## 2024-01-29 ENCOUNTER — Other Ambulatory Visit: Payer: Self-pay | Admitting: Family Medicine

## 2024-01-29 DIAGNOSIS — Z1231 Encounter for screening mammogram for malignant neoplasm of breast: Secondary | ICD-10-CM

## 2024-02-22 ENCOUNTER — Ambulatory Visit
Admission: RE | Admit: 2024-02-22 | Discharge: 2024-02-22 | Disposition: A | Discharge status: Home / Self Care | Source: Ambulatory Visit | Attending: Family Medicine | Admitting: Family Medicine

## 2024-02-22 DIAGNOSIS — Z1231 Encounter for screening mammogram for malignant neoplasm of breast: Secondary | ICD-10-CM
